# Patient Record
Sex: Male | Born: 1935 | Race: White | Marital: Married | Smoking: Never smoker
Health system: Southern US, Community
[De-identification: ages and names within clinical notes are randomized; demographics above are authoritative.]

## PROBLEM LIST (undated history)

## (undated) DIAGNOSIS — F329 Major depressive disorder, single episode, unspecified: Secondary | ICD-10-CM

## (undated) DIAGNOSIS — I10 Essential (primary) hypertension: Secondary | ICD-10-CM

## (undated) DIAGNOSIS — F32A Depression, unspecified: Secondary | ICD-10-CM

## (undated) DIAGNOSIS — T7840XA Allergy, unspecified, initial encounter: Secondary | ICD-10-CM

## (undated) DIAGNOSIS — F419 Anxiety disorder, unspecified: Secondary | ICD-10-CM

## (undated) DIAGNOSIS — I519 Heart disease, unspecified: Secondary | ICD-10-CM

## (undated) HISTORY — DX: Anxiety disorder, unspecified: F41.9

## (undated) HISTORY — DX: Essential (primary) hypertension: I10

## (undated) HISTORY — PX: TOTAL HIP ARTHROPLASTY: SHX124

## (undated) HISTORY — DX: Major depressive disorder, single episode, unspecified: F32.9

## (undated) HISTORY — DX: Allergy, unspecified, initial encounter: T78.40XA

## (undated) HISTORY — DX: Heart disease, unspecified: I51.9

## (undated) HISTORY — PX: TONSILLECTOMY: SUR1361

## (undated) HISTORY — DX: Depression, unspecified: F32.A

## (undated) HISTORY — PX: CORONARY STENT PLACEMENT: SHX1402

---

## 2015-01-17 DIAGNOSIS — K580 Irritable bowel syndrome with diarrhea: Secondary | ICD-10-CM | POA: Diagnosis not present

## 2015-01-28 DIAGNOSIS — F411 Generalized anxiety disorder: Secondary | ICD-10-CM | POA: Diagnosis not present

## 2015-01-28 DIAGNOSIS — F3289 Other specified depressive episodes: Secondary | ICD-10-CM | POA: Diagnosis not present

## 2015-01-28 DIAGNOSIS — F5101 Primary insomnia: Secondary | ICD-10-CM | POA: Diagnosis not present

## 2015-02-14 DIAGNOSIS — E559 Vitamin D deficiency, unspecified: Secondary | ICD-10-CM | POA: Diagnosis not present

## 2015-02-14 DIAGNOSIS — F439 Reaction to severe stress, unspecified: Secondary | ICD-10-CM | POA: Diagnosis not present

## 2015-02-14 DIAGNOSIS — M1288 Other specific arthropathies, not elsewhere classified, other specified site: Secondary | ICD-10-CM | POA: Diagnosis not present

## 2015-02-14 DIAGNOSIS — E782 Mixed hyperlipidemia: Secondary | ICD-10-CM | POA: Diagnosis not present

## 2015-02-14 DIAGNOSIS — Z23 Encounter for immunization: Secondary | ICD-10-CM | POA: Diagnosis not present

## 2015-02-14 DIAGNOSIS — I10 Essential (primary) hypertension: Secondary | ICD-10-CM | POA: Diagnosis not present

## 2015-03-06 DIAGNOSIS — H2513 Age-related nuclear cataract, bilateral: Secondary | ICD-10-CM | POA: Diagnosis not present

## 2015-03-12 DIAGNOSIS — K529 Noninfective gastroenteritis and colitis, unspecified: Secondary | ICD-10-CM | POA: Diagnosis not present

## 2015-03-12 DIAGNOSIS — K648 Other hemorrhoids: Secondary | ICD-10-CM | POA: Diagnosis not present

## 2015-03-12 DIAGNOSIS — K580 Irritable bowel syndrome with diarrhea: Secondary | ICD-10-CM | POA: Diagnosis not present

## 2015-03-12 DIAGNOSIS — I2510 Atherosclerotic heart disease of native coronary artery without angina pectoris: Secondary | ICD-10-CM | POA: Diagnosis not present

## 2015-03-12 DIAGNOSIS — R100 Acute abdomen: Secondary | ICD-10-CM | POA: Diagnosis not present

## 2015-03-12 DIAGNOSIS — R197 Diarrhea, unspecified: Secondary | ICD-10-CM | POA: Diagnosis not present

## 2015-03-12 DIAGNOSIS — G4733 Obstructive sleep apnea (adult) (pediatric): Secondary | ICD-10-CM | POA: Diagnosis not present

## 2015-03-12 DIAGNOSIS — R109 Unspecified abdominal pain: Secondary | ICD-10-CM | POA: Diagnosis not present

## 2015-03-27 DIAGNOSIS — N401 Enlarged prostate with lower urinary tract symptoms: Secondary | ICD-10-CM | POA: Diagnosis not present

## 2015-03-27 DIAGNOSIS — R3915 Urgency of urination: Secondary | ICD-10-CM | POA: Diagnosis not present

## 2015-03-27 DIAGNOSIS — R350 Frequency of micturition: Secondary | ICD-10-CM | POA: Diagnosis not present

## 2015-04-03 DIAGNOSIS — I6503 Occlusion and stenosis of bilateral vertebral arteries: Secondary | ICD-10-CM | POA: Diagnosis not present

## 2015-04-03 DIAGNOSIS — Z01810 Encounter for preprocedural cardiovascular examination: Secondary | ICD-10-CM | POA: Diagnosis not present

## 2015-04-03 DIAGNOSIS — I2510 Atherosclerotic heart disease of native coronary artery without angina pectoris: Secondary | ICD-10-CM | POA: Diagnosis not present

## 2015-04-03 DIAGNOSIS — Z955 Presence of coronary angioplasty implant and graft: Secondary | ICD-10-CM | POA: Diagnosis not present

## 2015-04-03 DIAGNOSIS — I351 Nonrheumatic aortic (valve) insufficiency: Secondary | ICD-10-CM | POA: Diagnosis not present

## 2015-05-14 DIAGNOSIS — F3289 Other specified depressive episodes: Secondary | ICD-10-CM | POA: Diagnosis not present

## 2015-05-14 DIAGNOSIS — F411 Generalized anxiety disorder: Secondary | ICD-10-CM | POA: Diagnosis not present

## 2015-05-14 DIAGNOSIS — F5101 Primary insomnia: Secondary | ICD-10-CM | POA: Diagnosis not present

## 2015-06-05 DIAGNOSIS — K580 Irritable bowel syndrome with diarrhea: Secondary | ICD-10-CM | POA: Diagnosis not present

## 2015-08-07 DIAGNOSIS — F411 Generalized anxiety disorder: Secondary | ICD-10-CM | POA: Diagnosis not present

## 2015-08-07 DIAGNOSIS — F5101 Primary insomnia: Secondary | ICD-10-CM | POA: Diagnosis not present

## 2015-08-07 DIAGNOSIS — F3289 Other specified depressive episodes: Secondary | ICD-10-CM | POA: Diagnosis not present

## 2015-11-06 DIAGNOSIS — Z23 Encounter for immunization: Secondary | ICD-10-CM | POA: Diagnosis not present

## 2015-11-28 DIAGNOSIS — I493 Ventricular premature depolarization: Secondary | ICD-10-CM | POA: Diagnosis not present

## 2015-11-28 DIAGNOSIS — R0602 Shortness of breath: Secondary | ICD-10-CM | POA: Diagnosis not present

## 2015-11-28 DIAGNOSIS — Z955 Presence of coronary angioplasty implant and graft: Secondary | ICD-10-CM | POA: Diagnosis not present

## 2015-11-28 DIAGNOSIS — I2510 Atherosclerotic heart disease of native coronary artery without angina pectoris: Secondary | ICD-10-CM | POA: Diagnosis not present

## 2015-12-17 DIAGNOSIS — F411 Generalized anxiety disorder: Secondary | ICD-10-CM | POA: Diagnosis not present

## 2015-12-17 DIAGNOSIS — F3289 Other specified depressive episodes: Secondary | ICD-10-CM | POA: Diagnosis not present

## 2015-12-17 DIAGNOSIS — F5101 Primary insomnia: Secondary | ICD-10-CM | POA: Diagnosis not present

## 2016-04-08 DIAGNOSIS — Z6823 Body mass index (BMI) 23.0-23.9, adult: Secondary | ICD-10-CM | POA: Diagnosis not present

## 2016-04-08 DIAGNOSIS — I10 Essential (primary) hypertension: Secondary | ICD-10-CM | POA: Diagnosis not present

## 2016-04-08 DIAGNOSIS — E782 Mixed hyperlipidemia: Secondary | ICD-10-CM | POA: Diagnosis not present

## 2016-04-08 DIAGNOSIS — F439 Reaction to severe stress, unspecified: Secondary | ICD-10-CM | POA: Diagnosis not present

## 2016-04-08 DIAGNOSIS — I2510 Atherosclerotic heart disease of native coronary artery without angina pectoris: Secondary | ICD-10-CM | POA: Diagnosis not present

## 2016-04-08 DIAGNOSIS — E559 Vitamin D deficiency, unspecified: Secondary | ICD-10-CM | POA: Diagnosis not present

## 2016-04-08 DIAGNOSIS — M1288 Other specific arthropathies, not elsewhere classified, other specified site: Secondary | ICD-10-CM | POA: Diagnosis not present

## 2016-04-08 DIAGNOSIS — Z111 Encounter for screening for respiratory tuberculosis: Secondary | ICD-10-CM | POA: Diagnosis not present

## 2016-07-03 ENCOUNTER — Ambulatory Visit (INDEPENDENT_AMBULATORY_CARE_PROVIDER_SITE_OTHER): Payer: Medicare Other | Admitting: Family Medicine

## 2016-07-03 ENCOUNTER — Encounter: Payer: Self-pay | Admitting: Family Medicine

## 2016-07-03 VITALS — BP 130/80 | HR 61 | Temp 98.7°F | Ht 68.0 in | Wt 158.2 lb

## 2016-07-03 DIAGNOSIS — F32A Depression, unspecified: Secondary | ICD-10-CM

## 2016-07-03 DIAGNOSIS — G4733 Obstructive sleep apnea (adult) (pediatric): Secondary | ICD-10-CM

## 2016-07-03 DIAGNOSIS — R198 Other specified symptoms and signs involving the digestive system and abdomen: Secondary | ICD-10-CM | POA: Diagnosis not present

## 2016-07-03 DIAGNOSIS — L570 Actinic keratosis: Secondary | ICD-10-CM

## 2016-07-03 DIAGNOSIS — F329 Major depressive disorder, single episode, unspecified: Secondary | ICD-10-CM | POA: Diagnosis not present

## 2016-07-03 DIAGNOSIS — H539 Unspecified visual disturbance: Secondary | ICD-10-CM

## 2016-07-03 DIAGNOSIS — R350 Frequency of micturition: Secondary | ICD-10-CM

## 2016-07-03 DIAGNOSIS — I2510 Atherosclerotic heart disease of native coronary artery without angina pectoris: Secondary | ICD-10-CM

## 2016-07-03 DIAGNOSIS — F419 Anxiety disorder, unspecified: Secondary | ICD-10-CM

## 2016-07-03 MED ORDER — ATORVASTATIN CALCIUM 10 MG PO TABS: 10.0000 mg | ORAL_TABLET | Freq: Every day | ORAL | 3 refills | Status: DC

## 2016-07-03 NOTE — Patient Instructions (Signed)
Nice to meet you. We will get you referred to see an ophthalmologist, psychiatrist, cardiologist, dermatologist, and ENT physician. I have refilled your Lipitor. If you develop thoughts of harming yourself, inability to swallow, chest pain, or shortness of breath please seek medical attention.

## 2016-07-03 NOTE — Progress Notes (Signed)
[name], MD Phone: [phone]  Shane Acevedo is a 83 y.o. male who presents today for new patient visit.  Anxiety/depression: Patient notes long history of this. It has worsened with their move to some degree. Mostly anxiety though there is some depression. No SI. Some mention of a prior history of bipolar disorder. Currently on Lamictal and trazodone. Was followed by psychiatrist.  Notes difficulty seeing at night particularly signs. Needs to see an ophthalmologist.  CAD: Status post stent placement. On Lipitor. No chest pain or shortness of breath.  History of mild OSA. Not on CPAP. Has difficulty sleeping related to his anxiety. Does not want further intervention for this at this time.   He notes over the last several weeks to a couple of months he's had issues with pills sticking in his throat when he swallows. He did also notes hoarseness that is new compared to prior. Has issues swallowing things like peanut butter as well. Other foods and liquids he does okay with. He was a former smoker 60+ years ago.  Patient notes he is followed by dermatology for actinic keratoses. He had them cryo-treated on his forehead. He does note his skin does tear easily.  He reports he was evaluated by urology for frequent urination and notes everything was cleared.  Active Ambulatory Problems    Diagnosis Date Noted  . Actinic keratoses 07/04/2016  . Vision changes 07/04/2016  . Anxiety and depression 07/04/2016  . Coronary artery disease involving native coronary artery of native heart without angina pectoris 07/04/2016  . Difficulty swallowing pills 07/04/2016  . OSA (obstructive sleep apnea) 07/04/2016  . Frequent urination 07/04/2016   Resolved Ambulatory Problems    Diagnosis Date Noted  . No Resolved Ambulatory Problems   Past Medical History:  Diagnosis Date  . Allergy   . Depression   . Heart disease   . Hypertension     Family History  Problem Relation Age of  Onset  . Alcoholism Other   . Arthritis Other   . Lung cancer Other   . Mental illness Other     Social History   Social History  . Marital status: Married    Spouse name: N/A  . Number of children: N/A  . Years of education: N/A   Occupational History  . Not on file.   Social History Main Topics  . Smoking status: Never Smoker  . Smokeless tobacco: Never Used  . Alcohol use Yes  . Drug use: No  . Sexual activity: Not on file   Other Topics Concern  . Not on file   Social History Narrative  . No narrative on file    ROS  General:  Negative for nexplained weight loss, fever Skin: Negative for new or changing mole, Positive for sore that won't heal HEENT: Positive for trouble seeing, hoarseness, trouble swallowing, Negative for trouble hearing, ringing in ears, mouth sores, change in voice. CV:  Negative for chest pain, dyspnea, edema, palpitations Resp: Negative for cough, dyspnea, hemoptysis GI: Negative for nausea, vomiting, diarrhea, constipation, abdominal pain, melena, hematochezia. GU: Positive for incontinence, frequent urination, sexual difficulty, Negative for dysuria, urinary hesitance, hematuria, vaginal or penile discharge, lumps in testicle or breasts MSK: Negative for muscle cramps or aches, joint pain or swelling Neuro: Negative for headaches, weakness, numbness, dizziness, passing out/fainting Psych: Positive for anxiety, memory problems, Negative for depression  Objective  Physical Exam Vitals:   07/03/16 1402  BP: 130/80  Pulse: 61  Temp: 98.7 F (37.1  C)    BP Readings from Last 3 Encounters:  07/03/16 130/80   Wt Readings from Last 3 Encounters:  07/03/16 158 lb 3.2 oz (71.8 kg)    Physical Exam  Constitutional: No distress.  HENT:  Head: Normocephalic and atraumatic.  Mouth/Throat: Oropharynx is clear and moist.  Eyes: Conjunctivae are normal. Pupils are equal, round, and reactive to light.  Cardiovascular: Normal rate, regular  rhythm and normal heart sounds.   Pulmonary/Chest: Effort normal and breath sounds normal.  Abdominal: Soft. Bowel sounds are normal. He exhibits no distension. There is no tenderness. There is no rebound and no guarding.  Musculoskeletal: He exhibits no edema.  Neurological: He is alert. Gait normal.  Skin: Skin is warm and dry. He is not diaphoretic.  Scattered AK's on forehead  Psychiatric:  Mood anxious, affect anxious     Assessment/Plan:   Actinic keratoses Refer to dermatology.  Coronary artery disease involving native coronary artery of native heart without angina pectoris Asymptomatic. Continue current medications. Refer to cardiology.  Vision changes Vision checked and relatively stable. Refer to ophthalmology.  Anxiety and depression Appears to be stable. We'll refer to psychiatry given possible history of bipolar and that he is on Lamictal.  Difficulty swallowing pills Discussed with patient that this is abnormal. We'll refer to ENT for evaluation.   OSA (obstructive sleep apnea) Patient wanted to continue to monitor. If symptoms worsen he'll let us know.  Frequent urination Previously evaluated by urology. Discussed continuing to monitor and if worsens letting us know.   Orders Placed This Encounter  Procedures  . Ambulatory referral to Ophthalmology    Referral Priority:   Routine    Referral Type:   Consultation    Referral Reason:   Specialty Services Required    Requested Specialty:   Ophthalmology    Number of Visits Requested:   1  . Ambulatory referral to Psychiatry    Referral Priority:   Routine    Referral Type:   Psychiatric    Referral Reason:   Specialty Services Required    Requested Specialty:   Psychiatry    Number of Visits Requested:   1  . Ambulatory referral to Dermatology    Referral Priority:   Routine    Referral Type:   Consultation    Referral Reason:   Specialty Services Required    Requested Specialty:   Dermatology     Number of Visits Requested:   1  . Ambulatory referral to Cardiology    Referral Priority:   Routine    Referral Type:   Consultation    Referral Reason:   Specialty Services Required    Requested Specialty:   Cardiology    Number of Visits Requested:   1  . Ambulatory referral to ENT    Referral Priority:   Routine    Referral Type:   Consultation    Referral Reason:   Specialty Services Required    Requested Specialty:   Otolaryngology    Number of Visits Requested:   [place], [place]

## 2016-07-04 ENCOUNTER — Encounter: Payer: Self-pay | Admitting: Family Medicine

## 2016-07-04 DIAGNOSIS — G4733 Obstructive sleep apnea (adult) (pediatric): Secondary | ICD-10-CM | POA: Insufficient documentation

## 2016-07-04 DIAGNOSIS — R350 Frequency of micturition: Secondary | ICD-10-CM | POA: Insufficient documentation

## 2016-07-04 DIAGNOSIS — H539 Unspecified visual disturbance: Secondary | ICD-10-CM | POA: Insufficient documentation

## 2016-07-04 DIAGNOSIS — F329 Major depressive disorder, single episode, unspecified: Secondary | ICD-10-CM | POA: Insufficient documentation

## 2016-07-04 DIAGNOSIS — I25118 Atherosclerotic heart disease of native coronary artery with other forms of angina pectoris: Secondary | ICD-10-CM | POA: Insufficient documentation

## 2016-07-04 DIAGNOSIS — F419 Anxiety disorder, unspecified: Secondary | ICD-10-CM

## 2016-07-04 DIAGNOSIS — R198 Other specified symptoms and signs involving the digestive system and abdomen: Secondary | ICD-10-CM | POA: Insufficient documentation

## 2016-07-04 DIAGNOSIS — F32A Depression, unspecified: Secondary | ICD-10-CM | POA: Insufficient documentation

## 2016-07-04 DIAGNOSIS — L570 Actinic keratosis: Secondary | ICD-10-CM | POA: Insufficient documentation

## 2016-07-04 NOTE — Assessment & Plan Note (Signed)
Appears to be stable. We'll refer to psychiatry given possible history of bipolar and that he is on Lamictal.

## 2016-07-04 NOTE — Assessment & Plan Note (Signed)
Vision checked and relatively stable. Refer to ophthalmology.

## 2016-07-04 NOTE — Assessment & Plan Note (Signed)
Discussed with patient that this is abnormal. We'll refer to ENT for evaluation.

## 2016-07-04 NOTE — Assessment & Plan Note (Signed)
Refer to dermatology 

## 2016-07-04 NOTE — Assessment & Plan Note (Signed)
Patient wanted to continue to monitor. If symptoms worsen he'll let us know.

## 2016-07-04 NOTE — Assessment & Plan Note (Signed)
Asymptomatic. Continue current medications. Refer to cardiology.

## 2016-07-04 NOTE — Assessment & Plan Note (Signed)
Previously evaluated by urology. Discussed continuing to monitor and if worsens letting us know.

## 2016-07-06 ENCOUNTER — Telehealth: Payer: Self-pay

## 2016-07-06 NOTE — Telephone Encounter (Signed)
Patients wife states she would like patients referral to eye doctor to be asap because he is having trouble seeing when driving and at home, since the prescription for his glasses are out dated.

## 2016-07-06 NOTE — Telephone Encounter (Signed)
Will forward to [facility].

## 2016-07-07 NOTE — Telephone Encounter (Signed)
Scheduled on 6/27 at [facility]. Pt aware

## 2016-07-08 DIAGNOSIS — H5203 Hypermetropia, bilateral: Secondary | ICD-10-CM | POA: Diagnosis not present

## 2016-07-08 DIAGNOSIS — H2513 Age-related nuclear cataract, bilateral: Secondary | ICD-10-CM | POA: Diagnosis not present

## 2016-07-08 DIAGNOSIS — I10 Essential (primary) hypertension: Secondary | ICD-10-CM | POA: Diagnosis not present

## 2016-07-08 DIAGNOSIS — H52223 Regular astigmatism, bilateral: Secondary | ICD-10-CM | POA: Diagnosis not present

## 2016-07-08 DIAGNOSIS — H524 Presbyopia: Secondary | ICD-10-CM | POA: Diagnosis not present

## 2016-07-09 ENCOUNTER — Encounter: Payer: Self-pay | Admitting: Family Medicine

## 2016-07-24 ENCOUNTER — Other Ambulatory Visit: Payer: Self-pay | Admitting: *Deleted

## 2016-07-24 MED ORDER — TRAZODONE HCL 50 MG PO TABS: 50.0000 mg | ORAL_TABLET | Freq: Every day | ORAL | 1 refills | Status: DC

## 2016-07-24 NOTE — Telephone Encounter (Signed)
Medication Refill requested for : trazodone  Pharmacy:[facility] on [address]  Return Contact : [phone]

## 2016-07-24 NOTE — Telephone Encounter (Signed)
Last OV 07/03/16 last filed under historical

## 2016-08-28 ENCOUNTER — Other Ambulatory Visit: Payer: Self-pay

## 2016-08-28 MED ORDER — LAMOTRIGINE 200 MG PO TABS: 200.0000 mg | ORAL_TABLET | Freq: Every day | ORAL | 0 refills | Status: DC

## 2016-08-28 NOTE — Telephone Encounter (Signed)
Last OV 07/03/16 filed under historical

## 2016-08-28 NOTE — Telephone Encounter (Signed)
Spoke with patient regarding his Lamictal. He's been on this for several years for bipolar disorder. He was previously followed by a psychiatrist prior to moving to [facility]. A referral has been placed to psychiatry though has not been scheduled. We will refill his Lamictal for one month. Discussed risk of rash and Stevens-Johnson syndrome. He'll monitor and if those things happen to be evaluated. We'll get him set up with psychiatry.

## 2016-10-05 ENCOUNTER — Ambulatory Visit (INDEPENDENT_AMBULATORY_CARE_PROVIDER_SITE_OTHER): Payer: Medicare Other | Admitting: Family Medicine

## 2016-10-05 ENCOUNTER — Encounter: Payer: Self-pay | Admitting: Family Medicine

## 2016-10-05 DIAGNOSIS — L570 Actinic keratosis: Secondary | ICD-10-CM | POA: Diagnosis not present

## 2016-10-05 DIAGNOSIS — Z23 Encounter for immunization: Secondary | ICD-10-CM | POA: Diagnosis not present

## 2016-10-05 DIAGNOSIS — F329 Major depressive disorder, single episode, unspecified: Secondary | ICD-10-CM | POA: Diagnosis not present

## 2016-10-05 DIAGNOSIS — I2510 Atherosclerotic heart disease of native coronary artery without angina pectoris: Secondary | ICD-10-CM

## 2016-10-05 DIAGNOSIS — F419 Anxiety disorder, unspecified: Secondary | ICD-10-CM | POA: Diagnosis not present

## 2016-10-05 DIAGNOSIS — F32A Depression, unspecified: Secondary | ICD-10-CM

## 2016-10-05 DIAGNOSIS — R198 Other specified symptoms and signs involving the digestive system and abdomen: Secondary | ICD-10-CM

## 2016-10-05 DIAGNOSIS — H539 Unspecified visual disturbance: Secondary | ICD-10-CM | POA: Diagnosis not present

## 2016-10-05 NOTE — Patient Instructions (Signed)
Nice to see you. We'll check on your referrals.

## 2016-10-05 NOTE — Assessment & Plan Note (Signed)
Asymptomatic. He'll keep his appointment with cardiology.

## 2016-10-05 NOTE — Assessment & Plan Note (Addendum)
Stable. Recent labs reviewed. We'll check on his psychiatry referral.

## 2016-10-05 NOTE — Assessment & Plan Note (Signed)
Improved with new glasses. Continue to monitor.

## 2016-10-05 NOTE — Progress Notes (Signed)
  [name], MD Phone: [phone]  Shane Acevedo is a 83 y.o. male who presents today for follow-up.  Anxiety/depression: Notes mild symptoms now. Still a lot of stress with the move. Has a little trouble sleeping. Currently on melatonin and trazodone for that. Continuing Lamictal as well. No SI or HI. Recent labs reviewed.  Continues to have issues with feeling as though food is sticking in his throat. Notes it is better than it was previously. No reflux symptoms. He has not yet seen ENT.  Has an appointment with cardiology set up for next week. He notes no chest pain or shortness of breath.  He sees dermatology this week.  He did see ophthalmology for his vision issues and they gave him a new prescription which does work better at night.  PMH: nonsmoker.   ROS see history of present illness  Objective  Physical Exam Vitals:   10/05/16 1413  BP: (!) 142/84  Pulse: (!) 57  Temp: 97.9 F (36.6 C)  SpO2: 98%    BP Readings from Last 3 Encounters:  10/05/16 (!) 142/84  07/03/16 130/80   Wt Readings from Last 3 Encounters:  10/05/16 156 lb (70.8 kg)  07/03/16 158 lb 3.2 oz (71.8 kg)    Physical Exam  Constitutional: No distress.  Cardiovascular: Normal rate, regular rhythm and normal heart sounds.   Pulmonary/Chest: Effort normal and breath sounds normal.  Musculoskeletal: He exhibits no edema.  Neurological: He is alert. Gait normal.  Skin: Skin is warm and dry. He is not diaphoretic.     Assessment/Plan: Please see individual problem list.  Coronary artery disease involving native coronary artery of native heart without angina pectoris Asymptomatic. He'll keep his appointment with cardiology.  Actinic keratoses Keep dermatology appointment for evaluation.  Vision changes Improved with new glasses. Continue to monitor.  Anxiety and depression Stable. Recent labs reviewed. We'll check on his psychiatry referral.  Difficulty swallowing  pills We will follow up on his ENT referral. He notes this is slightly improved.  [name], MD [place]

## 2016-10-05 NOTE — Assessment & Plan Note (Signed)
Keep dermatology appointment for evaluation.

## 2016-10-05 NOTE — Assessment & Plan Note (Signed)
We will follow up on his ENT referral. He notes this is slightly improved.

## 2016-10-06 ENCOUNTER — Encounter: Payer: Self-pay | Admitting: Family Medicine

## 2016-10-06 ENCOUNTER — Other Ambulatory Visit: Payer: Self-pay | Admitting: Family Medicine

## 2016-10-07 DIAGNOSIS — L570 Actinic keratosis: Secondary | ICD-10-CM | POA: Diagnosis not present

## 2016-10-07 DIAGNOSIS — L218 Other seborrheic dermatitis: Secondary | ICD-10-CM | POA: Diagnosis not present

## 2016-10-07 DIAGNOSIS — D2261 Melanocytic nevi of right upper limb, including shoulder: Secondary | ICD-10-CM | POA: Diagnosis not present

## 2016-10-07 DIAGNOSIS — D2272 Melanocytic nevi of left lower limb, including hip: Secondary | ICD-10-CM | POA: Diagnosis not present

## 2016-10-07 DIAGNOSIS — D225 Melanocytic nevi of trunk: Secondary | ICD-10-CM | POA: Diagnosis not present

## 2016-10-07 DIAGNOSIS — X32XXXA Exposure to sunlight, initial encounter: Secondary | ICD-10-CM | POA: Diagnosis not present

## 2016-10-12 ENCOUNTER — Ambulatory Visit: Payer: Medicare Other | Admitting: Cardiovascular Disease

## 2016-10-29 DIAGNOSIS — K219 Gastro-esophageal reflux disease without esophagitis: Secondary | ICD-10-CM | POA: Diagnosis not present

## 2016-10-29 DIAGNOSIS — G4733 Obstructive sleep apnea (adult) (pediatric): Secondary | ICD-10-CM | POA: Diagnosis not present

## 2016-10-29 DIAGNOSIS — R490 Dysphonia: Secondary | ICD-10-CM | POA: Diagnosis not present

## 2016-11-09 ENCOUNTER — Ambulatory Visit (INDEPENDENT_AMBULATORY_CARE_PROVIDER_SITE_OTHER): Payer: Medicare Other | Admitting: Psychiatry

## 2016-11-09 ENCOUNTER — Encounter: Payer: Self-pay | Admitting: Psychiatry

## 2016-11-09 VITALS — BP 168/75 | HR 89 | Temp 97.9°F | Wt 160.2 lb

## 2016-11-09 DIAGNOSIS — G4733 Obstructive sleep apnea (adult) (pediatric): Secondary | ICD-10-CM | POA: Diagnosis not present

## 2016-11-09 DIAGNOSIS — F411 Generalized anxiety disorder: Secondary | ICD-10-CM | POA: Diagnosis not present

## 2016-11-09 MED ORDER — TRAZODONE HCL 50 MG PO TABS: 50.0000 mg | ORAL_TABLET | Freq: Every day | ORAL | 2 refills | Status: DC

## 2016-11-09 MED ORDER — CITALOPRAM HYDROBROMIDE 10 MG PO TABS: 10.0000 mg | ORAL_TABLET | Freq: Every day | ORAL | 2 refills | Status: DC

## 2016-11-09 MED ORDER — LAMOTRIGINE 200 MG PO TABS: 200.0000 mg | ORAL_TABLET | Freq: Every day | ORAL | 2 refills | Status: DC

## 2016-11-09 NOTE — Progress Notes (Signed)
Psychiatric Initial Adult Assessment   Patient Identification: Shane Acevedo MRN:  [id number] Date of Evaluation:  11/09/2016 Referral Source: Dr.Sonnenberg Chief Complaint:  " I am anxious". Chief Complaint    Establish Care; Stress; Anxiety     Visit Diagnosis:    ICD-10-CM   1. GAD (generalized anxiety disorder) F41.1 lamoTRIgine (LAMICTAL) 200 MG tablet    traZODone (DESYREL) 50 MG tablet    citalopram (CELEXA) 10 MG tablet  2. OSA (obstructive sleep apnea) G47.33     History of Present Illness: Shane Acevedo is an 83 year old Caucasian male, who is married, lives in [place], has a history of anxiety, as well as mood symptoms, insomnia, who presented to the clinic today to establish care.  Ko today presents as pleasant but talkative.  Dominque reports that he recently moved to [place] to be closer to his daughter.  He reports that the move itself has been overwhelming for him.  He currently lives in a senior citizen community.  His wife also lives with him.  He reports that he himself struggles with a lot of health issues.  He also takes care of his wife who is dealing with a lot of health issues herself.  He reports that his wife requires constant attention from him.  And this can sometimes be overwhelming for him.  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 He has a housekeeper who comes and helps him very week .  He reports he also struggle with some financial issues that happened during the move.  He reports that he worries constantly.  He also has some physical symptoms of anxiety like feeling flushed.  He denies any chest pain shortness of breath or other anxiety attack symptoms.  He does report feeling sad on and off.  He denies any crying spells.  He denies any hopelessness.  He denies any suicidal thoughts.  He does report sleep issues on and off.  He reports he takes trazodone 50 mg as well as melatonin.  He reports that his sleep varies anywhere from 5-7 hours.   He also has a diagnosis of OSA.  He does not use a CPAP and was told he does not need it, however ,his sleep study was done 4 years ago.  He denies any psychosis.  He does report some memory problems on and off.  He reports that he can be forgetful and needs to keep checklist and alerts to remind him of things that he needs to do.  He reports that he copes by talking to people, has joined some clubs, going for walks with his dog.  He reports that all this relaxes him and isgood therapy for him.  He reports that he currently takes a medication called lamotrigine 200 mg, which was initiated by his psychiatrist in [place].  He reports that it was started because they felt he may be in the bipolar spectrum, because he talked a lot on and off and was impulsive.  However he denies any history of manic symptoms or hypomania.  He appears oriented , alert, was able to give writer information about his mental health issues, medications as well as was able to give correct details about his personal information like his address, phone number.   He denies any inpatient hospital admissions for mental health issues.  He denies any suicide attempts.  Associated Signs/Symptoms: Depression Symptoms:  depressed mood, difficulty concentrating, anxiety, (Hypo) Manic Symptoms:  Distractibility, Anxiety Symptoms:  Excessive Worry, Psychotic Symptoms:  denies PTSD Symptoms: Negative  Past Psychiatric History: History of anxiety and insomnia.  Carried a diagnosis of mood disorder in the past.  He was being treated by Dr. Barbarann Ehlers in [place].  He denies any history of suicide attempts.  He denies any past mental health hospital admissions.  Previous Psychotropic Medications: Yes - celexa- for a week , discontinued it since he did not want to be on lots of medications.  Substance Abuse History in the last 12 months:  No.  Consequences of Substance Abuse: Negative  Past Medical History:  Past Medical History:   Diagnosis Date  . Allergy   . Anxiety   . Depression   . Heart disease   . Hypertension     Past Surgical History:  Procedure Laterality Date  . CORONARY STENT PLACEMENT Right   . TONSILLECTOMY    . TOTAL HIP ARTHROPLASTY Right     Family Psychiatric History: Father has history of bipolar disorder, sister has paranoid schizophrenia, mother with Alzheimer's dementia, wife has depression.  Family History:  Family History  Problem Relation Age of Onset  . Alcoholism Other   . Arthritis Other   . Lung cancer Other   . Mental illness Other   . Bipolar disorder Father   . Schizophrenia Sister     Social History:   Social History   Social History  . Marital status: Married    Spouse name: N/A  . Number of children: N/A  . Years of education: N/A   Social History Main Topics  . Smoking status: Never Smoker  . Smokeless tobacco: Never Used  . Alcohol use 2.4 oz/week    2 Glasses of wine, 2 Cans of beer per week  . Drug use: No  . Sexual activity: Not Currently   Other Topics Concern  . None   Social History Narrative  . None    Additional Social History: He is married.  He has 2 daughters.  He used to work in the [employer], inspected hospitals.  He also did a lot of other work in the past.  He is currently retired.  He moved from [place] to [facility] in May 2018 to be closer to his daughter who currently lives in [place]. He currently lives in a senior living community in [place].  Allergies:  No Known Allergies  Metabolic Disorder Labs: No results found for: HGBA1C, MPG No results found for: PROLACTIN No results found for: CHOL, TRIG, HDL, CHOLHDL, VLDL, LDLCALC   Current Medications: Current Outpatient Prescriptions  Medication Sig Dispense Refill  . amLODipine (NORVASC) 10 MG tablet Take 10 mg by mouth daily.    Marland Kitchen aspirin EC 81 MG tablet Take 81 mg by mouth daily.    Marland Kitchen atorvastatin (LIPITOR) 10 MG tablet Take 1 tablet (10 mg  total) by mouth daily. 90 tablet 3  . lamoTRIgine (LAMICTAL) 200 MG tablet Take 1 tablet (200 mg total) by mouth daily. 30 tablet 2  . Melatonin 10 MG TABS Take by mouth.    . Multiple Vitamins-Minerals (CENTRUM SILVER PO) Take by mouth.    . traZODone (DESYREL) 50 MG tablet Take 1-2 tablets (50-100 mg total) by mouth at bedtime. 60 tablet 2  . citalopram (CELEXA) 10 MG tablet Take 1 tablet (10 mg total) by mouth daily. 30 tablet 2   No current facility-administered medications for this visit.     Neurologic: Headache: No Seizure: No Paresthesias:No  Musculoskeletal: Strength & Muscle Tone: within normal limits  Gait & Station: normal Patient leans: N/A  Psychiatric Specialty Exam: Review of Systems  Psychiatric/Behavioral: Positive for depression. The patient is nervous/anxious and has insomnia.   All other systems reviewed and are negative.   Blood pressure (!) 168/75, pulse 89, temperature 97.9 F (36.6 C), temperature source Oral, weight 160 lb 3.2 oz (72.7 kg).Body mass index is 24.36 kg/m.  General Appearance: Casual  Eye Contact:  Fair  Speech:  Clear and Coherent  Volume:  Normal  Mood:  Anxious and Dysphoric  Affect:  Congruent  Thought Process:  Goal Directed and Descriptions of Associations: Intact  Orientation:  Full (Time, Place, and Person)  Thought Content:  Logical  Suicidal Thoughts:  No  Homicidal Thoughts:  No  Memory:  Immediate;   Fair Recent;   Fair Remote;   Fair  Judgement:  Fair  Insight:  Fair  Psychomotor Activity:  Normal  Concentration:  Concentration: Fair and Attention Span: Fair  Recall:  [employer] of Knowledge:Fair  Language: Fair  Akathisia:  No  Handed:  Right  AIMS (if indicated):  NA  Assets:  Communication Skills Desire for Improvement Housing Intimacy Social Support Talents/Skills Transportation Vocational/Educational  ADL's:  Intact  Cognition: WNL  Sleep:  Fair on medications    Treatment Plan Summary: Mont[place] is  an 83 year old Caucasian male who has a history of anxiety, mood disorder, insomnia, OSA , as well as medical problems like coronary artery disease, hypertension , who presented to the clinic today to establish care.  Darry struggles with the recent relocation to [facility], health issues, health problems of his wife as well as financial issues.  Avyn however has good coping techniques, has good social support from his daughter who lives close by, and has access to healthcare, and is motivated to get help.  Will start him on medications as noted below.  He is a good candidate for outpatient treatment. Medication management and Plan see below  Anxiety Start Celexa 10 mg p.o. daily. Continue lamotrigine 200 mg p.o. daily.  For insomnia Patient reports a history of OSA.  Reports he did not needed to be on CPAP, however this was done 4 years ago. Discussed referral back to sleep clinic, repeat sleep study. Continue trazodone 50-100 mg p.o. nightly as needed for sleep. Discussed to try stopping the melatonin.  Discussed getting his TSH as well as an EKG of his heart,  he reports that he will be able to get it from his primary medical doctor who had done some of the testings 2 months ago.  I have reviewed medical records from Barbarann Ehlers [place].  Provided medication education.  Provided handouts.  Follow-up in 4 weeks.  More than 50 % of the time was spent for psychoeducation and supportive psychotherapy and care coordination.  This note was generated in part or whole with voice recognition software. Voice recognition is usually quite accurate but there are transcription errors that can and very often do occur. I apologize for any typographical errors that were not detected and corrected.       Ursula Alert, MD 10/29/20182:58 PM

## 2016-11-09 NOTE — Patient Instructions (Signed)
Citalopram tablets What is this medicine? CITALOPRAM (sye TAL oh pram) is a medicine for depression. This medicine may be used for other purposes; ask your health care provider or pharmacist if you have questions. COMMON BRAND NAME(S): Celexa What should I tell my health care provider before I take this medicine? They need to know if you have any of these conditions: -bleeding disorders -bipolar disorder or a family history of bipolar disorder -glaucoma -heart disease -history of irregular heartbeat -kidney disease -liver disease -low levels of magnesium or potassium in the blood -receiving electroconvulsive therapy -seizures -suicidal thoughts, plans, or attempt; a previous suicide attempt by you or a family member -take medicines that treat or prevent blood clots -thyroid disease -an unusual or allergic reaction to citalopram, escitalopram, other medicines, foods, dyes, or preservatives -pregnant or trying to become pregnant -breast-feeding How should I use this medicine? Take this medicine by mouth with a glass of water. Follow the directions on the prescription label. You can take it with or without food. Take your medicine at regular intervals. Do not take your medicine more often than directed. Do not stop taking this medicine suddenly except upon the advice of your doctor. Stopping this medicine too quickly may cause serious side effects or your condition may worsen. A special MedGuide will be given to you by the pharmacist with each prescription and refill. Be sure to read this information carefully each time. Talk to your pediatrician regarding the use of this medicine in children. Special care may be needed. Patients over 81 years old may have a stronger reaction and need a smaller dose. Overdosage: If you think you have taken too much of this medicine contact a poison control center or emergency room at once. NOTE: This medicine is only for you. Do not share this medicine with  others. What if I miss a dose? If you miss a dose, take it as soon as you can. If it is almost time for your next dose, take only that dose. Do not take double or extra doses. What may interact with this medicine? Do not take this medicine with any of the following medications: -certain medicines for fungal infections like fluconazole, itraconazole, ketoconazole, posaconazole, voriconazole -cisapride -dofetilide -dronedarone -escitalopram -linezolid -MAOIs like Carbex, Eldepryl, Marplan, Nardil, and Parnate -methylene blue (injected into a vein) -pimozide -thioridazine -ziprasidone This medicine may also interact with the following medications: -alcohol -amphetamines -aspirin and aspirin-like medicines -carbamazepine -certain medicines for depression, anxiety, or psychotic disturbances -certain medicines for infections like chloroquine, clarithromycin, erythromycin, furazolidone, isoniazid, pentamidine -certain medicines for migraine headaches like almotriptan, eletriptan, frovatriptan, naratriptan, rizatriptan, sumatriptan, zolmitriptan -certain medicines for sleep -certain medicines that treat or prevent blood clots like dalteparin, enoxaparin, warfarin -cimetidine -diuretics -fentanyl -lithium -methadone -metoprolol -NSAIDs, medicines for pain and inflammation, like ibuprofen or naproxen -omeprazole -other medicines that prolong the QT interval (cause an abnormal heart rhythm) -procarbazine -rasagiline -supplements like St. Damondre's wort, kava kava, valerian -tramadol -tryptophan This list may not describe all possible interactions. Give your health care provider a list of all the medicines, herbs, non-prescription drugs, or dietary supplements you use. Also tell them if you smoke, drink alcohol, or use illegal drugs. Some items may interact with your medicine. What should I watch for while using this medicine? Tell your doctor if your symptoms do not get better or if they  get worse. Visit your doctor or health care professional for regular checks on your progress. Because it may take several weeks to see the full   effects of this medicine, it is important to continue your treatment as prescribed by your doctor. Patients and their families should watch out for new or worsening thoughts of suicide or depression. Also watch out for sudden changes in feelings such as feeling anxious, agitated, panicky, irritable, hostile, aggressive, impulsive, severely restless, overly excited and hyperactive, or not being able to sleep. If this happens, especially at the beginning of treatment or after a change in dose, call your health care professional. You may get drowsy or dizzy. Do not drive, use machinery, or do anything that needs mental alertness until you know how this medicine affects you. Do not stand or sit up quickly, especially if you are an older patient. This reduces the risk of dizzy or fainting spells. Alcohol may interfere with the effect of this medicine. Avoid alcoholic drinks. Your mouth may get dry. Chewing sugarless gum or sucking hard candy, and drinking plenty of water will help. Contact your doctor if the problem does not go away or is severe. What side effects may I notice from receiving this medicine? Side effects that you should report to your doctor or health care professional as soon as possible: -allergic reactions like skin rash, itching or hives, swelling of the face, lips, or tongue -anxious -black, tarry stools -breathing problems -changes in vision -chest pain -confusion -elevated mood, decreased need for sleep, racing thoughts, impulsive behavior -eye pain -fast, irregular heartbeat -feeling faint or lightheaded, falls -feeling agitated, angry, or irritable -hallucination, loss of contact with reality -loss of balance or coordination -loss of memory -painful or prolonged erections -restlessness, pacing, inability to keep  still -seizures -stiff muscles -suicidal thoughts or other mood changes -trouble sleeping -unusual bleeding or bruising -unusually weak or tired -vomiting Side effects that usually do not require medical attention (report to your doctor or health care professional if they continue or are bothersome): -change in appetite or weight -change in sex drive or performance -dizziness -headache -increased sweating -indigestion, nausea -tremors This list may not describe all possible side effects. Call your doctor for medical advice about side effects. You may report side effects to FDA at 1-800-FDA-1088. Where should I keep my medicine? Keep out of reach of children. Store at room temperature between 15 and 30 degrees C (59 and 86 degrees F). Throw away any unused medicine after the expiration date. NOTE: This sheet is a summary. It may not cover all possible information. If you have questions about this medicine, talk to your doctor, pharmacist, or health care provider.  2018 Elsevier/Gold Standard (2015-06-03 13:18:52)  

## 2016-11-11 ENCOUNTER — Other Ambulatory Visit: Payer: Self-pay | Admitting: Psychiatry

## 2016-11-11 DIAGNOSIS — F411 Generalized anxiety disorder: Secondary | ICD-10-CM

## 2016-11-11 MED ORDER — ESCITALOPRAM OXALATE 5 MG PO TABS: 5.0000 mg | ORAL_TABLET | Freq: Every day | ORAL | 1 refills | Status: DC

## 2016-11-11 NOTE — Telephone Encounter (Signed)
Patient called Shane Acevedo CMA stating he wants to change his celexa to lexapro. He feels his wife has a good response to it and so will he . Will start Lexapro. Will stop celexa.

## 2016-11-16 ENCOUNTER — Telehealth: Payer: Self-pay

## 2016-11-16 NOTE — Telephone Encounter (Signed)
pt called he can not find rx for the lexapro.  he can not find.

## 2016-11-17 NOTE — Telephone Encounter (Signed)
Rx was called into the [facility] per dr. Shea Evans ok.  Pt was also notified that rx was called in.

## 2016-11-17 NOTE — Telephone Encounter (Signed)
Yes, please Janett Billow. thankx

## 2016-11-26 ENCOUNTER — Ambulatory Visit (INDEPENDENT_AMBULATORY_CARE_PROVIDER_SITE_OTHER): Payer: Medicare Other | Admitting: Cardiovascular Disease

## 2016-11-26 ENCOUNTER — Encounter: Payer: Self-pay | Admitting: Cardiovascular Disease

## 2016-11-26 VITALS — BP 140/66 | HR 51 | Ht 69.0 in | Wt 158.5 lb

## 2016-11-26 DIAGNOSIS — E782 Mixed hyperlipidemia: Secondary | ICD-10-CM | POA: Diagnosis not present

## 2016-11-26 DIAGNOSIS — I25118 Atherosclerotic heart disease of native coronary artery with other forms of angina pectoris: Secondary | ICD-10-CM

## 2016-11-26 DIAGNOSIS — G4733 Obstructive sleep apnea (adult) (pediatric): Secondary | ICD-10-CM | POA: Diagnosis not present

## 2016-11-26 DIAGNOSIS — F32A Depression, unspecified: Secondary | ICD-10-CM

## 2016-11-26 DIAGNOSIS — Z955 Presence of coronary angioplasty implant and graft: Secondary | ICD-10-CM | POA: Diagnosis not present

## 2016-11-26 DIAGNOSIS — Z87891 Personal history of nicotine dependence: Secondary | ICD-10-CM | POA: Diagnosis not present

## 2016-11-26 DIAGNOSIS — I209 Angina pectoris, unspecified: Secondary | ICD-10-CM | POA: Diagnosis not present

## 2016-11-26 DIAGNOSIS — I2510 Atherosclerotic heart disease of native coronary artery without angina pectoris: Secondary | ICD-10-CM | POA: Diagnosis not present

## 2016-11-26 DIAGNOSIS — F329 Major depressive disorder, single episode, unspecified: Secondary | ICD-10-CM | POA: Diagnosis not present

## 2016-11-26 DIAGNOSIS — I10 Essential (primary) hypertension: Secondary | ICD-10-CM

## 2016-11-26 DIAGNOSIS — F419 Anxiety disorder, unspecified: Secondary | ICD-10-CM | POA: Diagnosis not present

## 2016-11-26 DIAGNOSIS — E785 Hyperlipidemia, unspecified: Secondary | ICD-10-CM | POA: Insufficient documentation

## 2016-11-26 NOTE — Progress Notes (Signed)
Cardiology Office Note  Date:  11/26/2016   ID:  Shane Acevedo, DOB March 16, 1933, MRN [id number]  PCP:  Leone Haven, MD   Chief Complaint  Patient presents with  . other    New patient. Referred by Dr. Caryl Bis for CAD. Patient c/o blood pressure being high. Meds reviewed verbally with patient.     HPI:  Shane Acevedo is a 83 y.o. 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 Caryl Bis for consultation of his coronary disease   Reports that over the past 20 years he has done relatively well Initially had angina, shortness of breath with stent placement 1997 denies having any interventions since that time  Managed by Dr. Nat Christen at [facility], had periodic routine treadmill studies Previous records reviewed  Reports he is active at baseline, Does water volleyball  Has a history of Anxiety/depression:  mild symptoms  Still a lot of stress , after recent move, financial stressors   Notes from primary care indicating he has periodic  feeling as though food is sticking in his throat.   Previous testing reviewed with him in detail today Treadmill stress test 11/2015 Echo 03/2015: normal EF, mild AI and MR Carotid u/s 03/2015: mild to moderate b/l   EKG personally reviewed by myself on todays visit Shows sinus bradycardia rate 51 bpm nonspecific ST abnormality, likely repolarization abnormality   PMH:   has a past medical history of Allergy, Anxiety, Depression, Heart disease, and Hypertension.  PSH:    Past Surgical History:  Procedure Laterality Date  . CORONARY STENT PLACEMENT Right   . TONSILLECTOMY    . TOTAL HIP ARTHROPLASTY Right     Current Outpatient Medications  Medication Sig Dispense Refill  .  amLODipine (NORVASC) 10 MG tablet Take 10 mg by mouth daily.    Marland Kitchen aspirin EC 81 MG tablet Take 81 mg by mouth daily.    Marland Kitchen atorvastatin (LIPITOR) 10 MG tablet Take 1 tablet (10 mg total) by mouth daily. 90 tablet 3  . escitalopram (LEXAPRO) 5 MG tablet Take 1 tablet (5 mg total) by mouth daily. 30 tablet 1  . lamoTRIgine (LAMICTAL) 200 MG tablet Take 1 tablet (200 mg total) by mouth daily. 30 tablet 2  . Melatonin 10 MG TABS Take by mouth.    . Multiple Vitamins-Minerals (CENTRUM SILVER PO) Take by mouth.    . traZODone (DESYREL) 50 MG tablet Take 1-2 tablets (50-100 mg total) by mouth at bedtime. 60 tablet 2   No current facility-administered medications for this visit.      Allergies:   Patient has no known allergies.   Social History:  The patient  reports that  has never smoked. he has never used smokeless tobacco. He reports that he drinks about 2.4 oz of alcohol per week. He reports that he does not use drugs.   Family History:   family history includes Alcoholism in his other; Arthritis in his other; Bipolar disorder in his father; Lung cancer in his other; Mental illness in his other; Schizophrenia in his sister.    Review of Systems: Review of Systems  Constitutional: Negative.   Respiratory: Negative.   Cardiovascular: Negative.   Gastrointestinal: Negative.   Musculoskeletal: Negative.   Neurological: Negative.   Psychiatric/Behavioral: Negative.  All other systems reviewed and are negative.    PHYSICAL EXAM: VS:  BP 140/66 (BP Location: Left Arm, Patient Position: Sitting, Cuff Size: Normal)   Pulse (!) 51   Ht 5\' 9"  (1.753 m)   Wt 158 lb 8 oz (71.9 kg)   BMI 23.41 kg/m  , BMI Body mass index is 23.41 kg/m. GEN: Well nourished, well developed, in no acute distress  HEENT: normal  Neck: no JVD, carotid bruits, or masses Cardiac: RRR; no murmurs, rubs, or gallops,no edema  Respiratory:  clear to auscultation bilaterally, normal work of breathing GI: soft,  nontender, nondistended, + BS MS: no deformity or atrophy  Skin: warm and dry, no rash Neuro:  Strength and sensation are intact Psych: euthymic mood, full affect    Recent Labs: No results found for requested labs within last 8760 hours.    Lipid Panel No results found for: CHOL, HDL, LDLCALC, TRIG    Wt Readings from Last 3 Encounters:  11/26/16 158 lb 8 oz (71.9 kg)  10/05/16 156 lb (70.8 kg)  07/03/16 158 lb 3.2 oz (71.8 kg)       ASSESSMENT AND PLAN:  Coronary artery disease of native artery of native heart with stable angina pectoris ([place]) - Plan: EKG 12-Lead Currently with no symptoms of angina. No further workup at this time. Continue current medication regimen.  Discussed previous anginal symptoms with him  OSA (obstructive sleep apnea) - Plan: EKG 12-Lead  Mixed hyperlipidemia - Plan: EKG 12-Lead Cholesterol is at goal on the current lipid regimen. No changes to the medications were made.  History of coronary artery stent placement Prior history of stent 20 years ago No intervention since that time  Essential hypertension Blood pressure elevated on arrival, improved on recheck Does not on a blood pressure cuff at home, recommended he buy one or check when at the wellness center where he goes to exercise Recommended systolic pressure less than 135  Anxiety and depression Significant stressors on recent move, financial stressors per the patient  Smoking history Remote smoking history when he was younger   Disposition:   F/U  6 months   Total encounter time more than 45 minutes  Greater than 50% was spent in counseling and coordination of care with the patient    Orders Placed This Encounter  Procedures  . EKG 12-Lead     Signed, Esmond Plants, M.D., Ph.D. 11/26/2016  [place], [place]

## 2016-11-26 NOTE — Patient Instructions (Addendum)
Please monitor your blood pressure   Medication Instructions:   No medication changes made  Labwork:  No new labs needed  Testing/Procedures:  No further testing at this time   Follow-Up: It was a pleasure seeing you in the office today. Please call us if you have new issues that need to be addressed before your next appt.  [phone]  Your physician wants you to follow-up in: 6 months.  You will receive a reminder letter in the mail two months in advance. If you don't receive a letter, please call our office to schedule the follow-up appointment.  If you need a refill on your cardiac medications before your next appointment, please call your pharmacy.

## 2016-12-25 ENCOUNTER — Other Ambulatory Visit: Payer: Self-pay

## 2016-12-25 DIAGNOSIS — R490 Dysphonia: Secondary | ICD-10-CM | POA: Diagnosis not present

## 2016-12-25 DIAGNOSIS — K219 Gastro-esophageal reflux disease without esophagitis: Secondary | ICD-10-CM | POA: Diagnosis not present

## 2016-12-25 DIAGNOSIS — F411 Generalized anxiety disorder: Secondary | ICD-10-CM

## 2016-12-25 NOTE — Telephone Encounter (Signed)
Last OV 10/05/16 last filled by Dr.Eappen 11/09/16 30 2rf Requesting 90 days supply

## 2016-12-25 NOTE — Telephone Encounter (Signed)
Patients wife notified

## 2016-12-25 NOTE — Telephone Encounter (Signed)
This medication refill needs to go to Dr Shea Evans. This is not a medication that I am comfortable refilling. Thanks.

## 2017-01-15 ENCOUNTER — Ambulatory Visit: Payer: Medicare Other | Admitting: Family Medicine

## 2017-01-18 ENCOUNTER — Other Ambulatory Visit: Payer: Self-pay

## 2017-01-18 ENCOUNTER — Encounter: Payer: Self-pay | Admitting: Family Medicine

## 2017-01-18 ENCOUNTER — Ambulatory Visit (INDEPENDENT_AMBULATORY_CARE_PROVIDER_SITE_OTHER): Payer: Medicare Other | Admitting: Family Medicine

## 2017-01-18 VITALS — BP 142/80 | HR 63 | Temp 98.3°F | Wt 160.0 lb

## 2017-01-18 DIAGNOSIS — F32A Depression, unspecified: Secondary | ICD-10-CM

## 2017-01-18 DIAGNOSIS — R351 Nocturia: Secondary | ICD-10-CM

## 2017-01-18 DIAGNOSIS — G4733 Obstructive sleep apnea (adult) (pediatric): Secondary | ICD-10-CM | POA: Diagnosis not present

## 2017-01-18 DIAGNOSIS — F419 Anxiety disorder, unspecified: Secondary | ICD-10-CM

## 2017-01-18 DIAGNOSIS — R198 Other specified symptoms and signs involving the digestive system and abdomen: Secondary | ICD-10-CM | POA: Diagnosis not present

## 2017-01-18 DIAGNOSIS — Z8781 Personal history of (healed) traumatic fracture: Secondary | ICD-10-CM

## 2017-01-18 DIAGNOSIS — N401 Enlarged prostate with lower urinary tract symptoms: Secondary | ICD-10-CM | POA: Diagnosis not present

## 2017-01-18 DIAGNOSIS — Z5181 Encounter for therapeutic drug level monitoring: Secondary | ICD-10-CM | POA: Diagnosis not present

## 2017-01-18 DIAGNOSIS — F329 Major depressive disorder, single episode, unspecified: Secondary | ICD-10-CM | POA: Diagnosis not present

## 2017-01-18 DIAGNOSIS — I10 Essential (primary) hypertension: Secondary | ICD-10-CM

## 2017-01-18 DIAGNOSIS — R350 Frequency of micturition: Secondary | ICD-10-CM

## 2017-01-18 LAB — POCT URINALYSIS DIPSTICK
BILIRUBIN UA: NEGATIVE
Glucose, UA: NEGATIVE
Ketones, UA: NEGATIVE
LEUKOCYTES UA: NEGATIVE
Nitrite, UA: NEGATIVE
PH UA: 5 (ref 5.0–8.0)
PROTEIN UA: NEGATIVE
RBC UA: NEGATIVE
Spec Grav, UA: 1.01 (ref 1.010–1.025)
UROBILINOGEN UA: 0.2 U/dL

## 2017-01-18 LAB — COMPREHENSIVE METABOLIC PANEL
ALBUMIN: 4 g/dL (ref 3.5–5.2)
ALT: 12 U/L (ref 0–53)
AST: 12 U/L (ref 0–37)
Alkaline Phosphatase: 63 U/L (ref 39–117)
BUN: 20 mg/dL (ref 6–23)
CHLORIDE: 104 meq/L (ref 96–112)
CO2: 27 meq/L (ref 19–32)
CREATININE: 1.11 mg/dL (ref 0.40–1.50)
Calcium: 9.1 mg/dL (ref 8.4–10.5)
GFR: 67.53 mL/min (ref >60.00)
GLUCOSE: 77 mg/dL (ref 70–99)
POTASSIUM: 4.9 meq/L (ref 3.5–5.1)
SODIUM: 138 meq/L (ref 135–145)
Total Bilirubin: 0.5 mg/dL (ref 0.2–1.2)
Total Protein: 6.3 g/dL (ref 6.0–8.3)

## 2017-01-18 LAB — PSA: PSA: 0.42 ng/mL (ref 0.10–4.00)

## 2017-01-18 NOTE — Progress Notes (Signed)
Shane Rumps, MD Phone: [phone]  Shane Acevedo is a 84 y.o. male who presents today for follow-up.  Hypertension: Not checking at home.  Is taking the amlodipine.  No chest pain, shortness of breath, or edema.  Reports some nocturia and urinary frequency.  Occurs during the day as well.  Some urgency.  Some dribbling as well.  No straining.  Patient does report a history of hemorrhoids with skin tags in the past.  Notes that the skin tag that is present has been there for a long time and was evaluated by GI previously.  He saw ENT for his issues with swallowing pills.  They placed him on Protonix and this has been helpful.  He notes they found narrowing of the vocal cords.  He does follow up with them in the next several months.  He has a history of compression fracture in his back 5 years ago.  He fell through a ceiling.  He notes rare soreness though no significant pain.  No numbness or weakness.  No incontinence.  Patient does report some anxiety and mild depression.  He is seeing psychiatry.  He has been on Lexapro.  Also on Lamictal.  No SI.  Social History   Tobacco Use  Smoking Status Never Smoker  Smokeless Tobacco Never Used     ROS see history of present illness  Objective  Physical Exam Vitals:   01/18/17 1107  BP: (!) 142/80  Pulse: 63  Temp: 98.3 F (36.8 C)  SpO2: 98%    BP Readings from Last 3 Encounters:  01/18/17 (!) 142/80  11/26/16 140/66  10/05/16 (!) 142/84   Wt Readings from Last 3 Encounters:  01/18/17 160 lb (72.6 kg)  11/26/16 158 lb 8 oz (71.9 kg)  10/05/16 156 lb (70.8 kg)    Physical Exam  Constitutional: No distress.  Cardiovascular: Normal rate, regular rhythm and normal heart sounds.  Pulmonary/Chest: Effort normal and breath sounds normal.  Genitourinary:  Genitourinary Comments: Skin tag from prior hemorrhoid located on rectum, normal rectal tone, mildly enlarged prostate with no nodules  Musculoskeletal: He  exhibits no edema.  No midline spine tenderness, no midline spine step-off  Neurological: He is alert. Gait normal.  Skin: Skin is warm and dry. He is not diaphoretic.     Assessment/Plan: Please see individual problem list.  OSA (obstructive sleep apnea) Patient is willing to undergo home sleep study.  Order placed.  Benign prostatic hyperplasia with nocturia Mild BPH.  No nodules.  Suspect this is the cause of his urinary frequency though we will check a urinalysis.  His IPSS score is 10 with mostly satisfied.  Consider Flomax in the future if he desires.  Anxiety and depression He is seeing psychiatry.  He will continue to see them.  Difficulty swallowing pills Sounds as though this was felt to be reflux.  This has improved with Protonix.  He will follow up with ENT.  History of compression fracture of spine Relatively asymptomatic.  He will continue to monitor.  Hypertension Well-controlled for age.  Continue current medication.   Shane Acevedo was seen today for follow-up.  Diagnoses and all orders for this visit:  Benign prostatic hyperplasia with nocturia -     PSA  Urinary frequency -     POCT Urinalysis Dipstick  Medication monitoring encounter -     Comp Met (CMET)  OSA (obstructive sleep apnea) -     Home sleep test  Anxiety and depression  Difficulty swallowing pills  History of compression fracture of spine  Essential hypertension    Orders Placed This Encounter  Procedures  . PSA  . Comp Met (CMET)  . POCT Urinalysis Dipstick  . Home sleep test    Order Specific Question:   Where should this test be performed:    Answer:   [place]    No orders of the defined types were placed in this encounter.    Shane Rumps, MD [place]

## 2017-01-18 NOTE — Patient Instructions (Signed)
Nice to see you. Please follow-up with your psychiatrist. We will check urine and your prostate today. We will get you set up for a sleep study.

## 2017-01-20 ENCOUNTER — Ambulatory Visit (INDEPENDENT_AMBULATORY_CARE_PROVIDER_SITE_OTHER): Payer: Medicare Other | Admitting: Psychiatry

## 2017-01-20 ENCOUNTER — Encounter: Payer: Self-pay | Admitting: Psychiatry

## 2017-01-20 ENCOUNTER — Other Ambulatory Visit: Payer: Self-pay

## 2017-01-20 VITALS — BP 142/77 | HR 55 | Temp 97.7°F | Wt 157.8 lb

## 2017-01-20 DIAGNOSIS — F411 Generalized anxiety disorder: Secondary | ICD-10-CM | POA: Diagnosis not present

## 2017-01-20 MED ORDER — MIRTAZAPINE 7.5 MG PO TABS: 7.5000 mg | ORAL_TABLET | Freq: Every day | ORAL | 1 refills | Status: DC

## 2017-01-20 MED ORDER — LAMOTRIGINE 200 MG PO TABS: 200.0000 mg | ORAL_TABLET | Freq: Every day | ORAL | 2 refills | Status: DC

## 2017-01-20 MED ORDER — TRAZODONE HCL 50 MG PO TABS: 25.0000 mg | ORAL_TABLET | Freq: Every day | ORAL | 1 refills | Status: DC

## 2017-01-20 NOTE — Patient Instructions (Signed)
Mirtazapine tablets What is this medicine? MIRTAZAPINE (mir TAZ a peen) is used to treat depression. This medicine may be used for other purposes; ask your health care provider or pharmacist if you have questions. COMMON BRAND NAME(S): Remeron What should I tell my health care provider before I take this medicine? They need to know if you have any of these conditions: -bipolar disorder -glaucoma -kidney disease -liver disease -suicidal thoughts -an unusual or allergic reaction to mirtazapine, other medicines, foods, dyes, or preservatives -pregnant or trying to get pregnant -breast-feeding How should I use this medicine? Take this medicine by mouth with a glass of water. Follow the directions on the prescription label. Take your medicine at regular intervals. Do not take your medicine more often than directed. Do not stop taking this medicine suddenly except upon the advice of your doctor. Stopping this medicine too quickly may cause serious side effects or your condition may worsen. A special MedGuide will be given to you by the pharmacist with each prescription and refill. Be sure to read this information carefully each time. Talk to your pediatrician regarding the use of this medicine in children. Special care may be needed. Overdosage: If you think you have taken too much of this medicine contact a poison control center or emergency room at once. NOTE: This medicine is only for you. Do not share this medicine with others. What if I miss a dose? If you miss a dose, take it as soon as you can. If it is almost time for your next dose, take only that dose. Do not take double or extra doses. What may interact with this medicine? Do not take this medicine with any of the following medications: -linezolid -MAOIs like Carbex, Eldepryl, Marplan, Nardil, and Parnate -methylene blue (injected into a vein) This medicine may also interact with the following medications: -alcohol -antiviral  medicines for HIV or AIDS -certain medicines that treat or prevent blood clots like warfarin -certain medicines for depression, anxiety, or psychotic disturbances -certain medicines for fungal infections like ketoconazole and itraconazole -certain medicines for migraine headache like almotriptan, eletriptan, frovatriptan, naratriptan, rizatriptan, sumatriptan, zolmitriptan -certain medicines for seizures like carbamazepine or phenytoin -certain medicines for sleep -cimetidine -erythromycin -fentanyl -lithium -medicines for blood pressure -nefazodone -rasagiline -rifampin -supplements like [place]'s wort, kava kava, valerian -tramadol -tryptophan This list may not describe all possible interactions. Give your health care provider a list of all the medicines, herbs, non-prescription drugs, or dietary supplements you use. Also tell them if you smoke, drink alcohol, or use illegal drugs. Some items may interact with your medicine. What should I watch for while using this medicine? Tell your doctor if your symptoms do not get better or if they get worse. Visit your doctor or health care professional for regular checks on your progress. Because it may take several weeks to see the full effects of this medicine, it is important to continue your treatment as prescribed by your doctor. Patients and their families should watch out for new or worsening thoughts of suicide or depression. Also watch out for sudden changes in feelings such as feeling anxious, agitated, panicky, irritable, hostile, aggressive, impulsive, severely restless, overly excited and hyperactive, or not being able to sleep. If this happens, especially at the beginning of treatment or after a change in dose, call your health care professional. You may get drowsy or dizzy. Do not drive, use machinery, or do anything that needs mental alertness until you know how this medicine affects you. Do not   stand or sit up quickly, especially if  you are an older patient. This reduces the risk of dizzy or fainting spells. Alcohol may interfere with the effect of this medicine. Avoid alcoholic drinks. This medicine may cause dry eyes and blurred vision. If you wear contact lenses you may feel some discomfort. Lubricating drops may help. See your eye doctor if the problem does not go away or is severe. Your mouth may get dry. Chewing sugarless gum or sucking hard candy, and drinking plenty of water may help. Contact your doctor if the problem does not go away or is severe. What side effects may I notice from receiving this medicine? Side effects that you should report to your doctor or health care professional as soon as possible: -allergic reactions like skin rash, itching or hives, swelling of the face, lips, or tongue -anxious -changes in vision -chest pain -confusion -elevated mood, decreased need for sleep, racing thoughts, impulsive behavior -eye pain -fast, irregular heartbeat -feeling faint or lightheaded, falls -feeling agitated, angry, or irritable -fever or chills, sore throat -hallucination, loss of contact with reality -loss of balance or coordination -mouth sores -redness, blistering, peeling or loosening of the skin, including inside the mouth -restlessness, pacing, inability to keep still -seizures -stiff muscles -suicidal thoughts or other mood changes -trouble passing urine or change in the amount of urine -trouble sleeping -unusual bleeding or bruising -unusually weak or tired -vomiting Side effects that usually do not require medical attention (report to your doctor or health care professional if they continue or are bothersome): -change in appetite -constipation -dizziness -dry mouth -muscle aches or pains -nausea -tired -weight gain This list may not describe all possible side effects. Call your doctor for medical advice about side effects. You may report side effects to FDA at 1-800-FDA-1088. Where  should I keep my medicine? Keep out of the reach of children. Store at room temperature between 15 and 30 degrees C (59 and 86 degrees F) Protect from light and moisture. Throw away any unused medicine after the expiration date. NOTE: This sheet is a summary. It may not cover all possible information. If you have questions about this medicine, talk to your doctor, pharmacist, or health care provider.  2018 Elsevier/Gold Standard (2015-05-30 17:30:45)  

## 2017-01-20 NOTE — Progress Notes (Signed)
[facility] MD OP Progress Note  01/21/2017 9:39 AM Dequincy Born  MRN:  [id number]  Chief Complaint: ' I am still not doing well."  Chief Complaint    Follow-up; Medication Refill; Medication Problem     HPI: Shane Acevedo is an 84 year old Caucasian male, who is married, lives in [place], has a history of anxiety as well as mood symptoms, insomnia, presented to the clinic today for a follow-up visit.  Tab today presents as pleasant and talkative.  Jaeven reports he continues to have anxiety symptoms when he feels overwhelmed at times.  He also has some tremors of his bilateral hands.  He reports he is the  primary caretaker of his wife who has been dealing with a lot of medical problems.  Patient also struggles with financial stressors after his recent relocation to [place].  He reports that his current living situation is much more expensive than it used to be and he worries about it a lot.  He reports he is on  Lexapro  5 mg but  he does not think the Lexapro is actually helping.  He reports that his daughters recently came to visit him and she told him that the Lexapro may not work for him since he tried it in the past and it did not work at that time.  He reports that he did not think about that when he was started on the Lexapro and that is why he could not get that information to Probation officer.  He also continues to struggle with some sleep issues.  He reports that he has a lot of work at home towards the end of the day.  He  also spends some quality time with his wife and does some reading and by the time he gets to bed it is close to 12 AM.  He reports he has been taking the trazodone as well as melatonin.  He however reports that his sleep is not very restful.  His dog wakes him up early in the morning and that also contributes to poor sleep.  He has a history of OSA but he does not use his CPAP now.  His primary medical doctor has discussed with him that he will be referred for a sleep study  again.   Continue to take lamotrigine 200 mg which was initiated by his psychiatrist in [place].  He denies any side effects from the same.  He reports he was started on that because they felt he may have been in the bipolar spectrum.  However he wonders whether he needs to be on it.  Denies any significant history of mania or hypomanic symptoms.    Visit Diagnosis:    ICD-10-CM   1. GAD (generalized anxiety disorder) F41.1 mirtazapine (REMERON) 7.5 MG tablet    traZODone (DESYREL) 50 MG tablet    lamoTRIgine (LAMICTAL) 200 MG tablet    Past Psychiatric History: For anxiety and insomnia.  Carried a diagnosis of mood disorder in the past.  He was being treated by Dr. Barbarann Ehlers in [place].  He denies any history of suicide attempts.  He denies any past mental health hospital admissions.  Lexapro in the past-Per his daughter (per patient)-did not work in the past.  He is currently on Lexapro but wants it to be changed to something else.   Past Medical History:  Past Medical History:  Diagnosis Date  . Allergy   . Anxiety   . Depression   . Heart disease   .  Hypertension     Past Surgical History:  Procedure Laterality Date  . CORONARY STENT PLACEMENT Right   . TONSILLECTOMY    . TOTAL HIP ARTHROPLASTY Right     Family Psychiatric History: Father has history of bipolar disorder, sister has paranoid schizophrenia, mother with Alzheimer's dementia, wife has depression  Family History:  Family History  Problem Relation Age of Onset  . Alcoholism Other   . Arthritis Other   . Lung cancer Other   . Mental illness Other   . Bipolar disorder Father   . Schizophrenia Sister    Substance abuse history Denied  Social History: Married.  He has 2 daughters.  He used to work in the [employer], inspected hospitals.  He also did a lot of other work in the past.  He is currently retired.  He moved from [place] to [place] in May 2018 to be closer to his  daughter who currently lives in [place].  He currently lives in a senior living community in [place]. Social History   Socioeconomic History  . Marital status: Married    Spouse name: None  . Number of children: None  . Years of education: None  . Highest education level: None  Social Needs  . Financial resource strain: None  . Food insecurity - worry: None  . Food insecurity - inability: None  . Transportation needs - medical: None  . Transportation needs - non-medical: None  Occupational History  . None  Tobacco Use  . Smoking status: Never Smoker  . Smokeless tobacco: Never Used  Substance and Sexual Activity  . Alcohol use: Yes    Alcohol/week: 2.4 oz    Types: 2 Glasses of wine, 2 Cans of beer per week  . Drug use: No  . Sexual activity: Not Currently  Other Topics Concern  . None  Social History Narrative  . None    Allergies: No Known Allergies  Metabolic Disorder Labs: No results found for: HGBA1C, MPG No results found for: PROLACTIN No results found for: CHOL, TRIG, HDL, CHOLHDL, VLDL, LDLCALC No results found for: TSH  Therapeutic Level Labs: No results found for: LITHIUM No results found for: VALPROATE No components found for:  CBMZ  Current Medications: Current Outpatient Medications  Medication Sig Dispense Refill  . amLODipine (NORVASC) 10 MG tablet Take 10 mg by mouth daily.    Marland Kitchen aspirin EC 81 MG tablet Take 81 mg by mouth daily.    Marland Kitchen atorvastatin (LIPITOR) 10 MG tablet Take 1 tablet (10 mg total) by mouth daily. 90 tablet 3  . lamoTRIgine (LAMICTAL) 200 MG tablet Take 1 tablet (200 mg total) by mouth daily with supper. 30 tablet 2  . Multiple Vitamins-Minerals (CENTRUM SILVER PO) Take by mouth.    Marland Kitchen omeprazole (PRILOSEC) 20 MG capsule Take 20 mg by mouth daily.    . traZODone (DESYREL) 50 MG tablet Take 0.5-1 tablets (25-50 mg total) by mouth at bedtime. 30 tablet 1  . mirtazapine (REMERON) 7.5 MG tablet Take 1 tablet (7.5 mg total) by  mouth at bedtime. 30 tablet 1   No current facility-administered medications for this visit.      Musculoskeletal: Strength & Muscle Tone: within normal limits Gait & Station: normal Patient leans: N/A  Psychiatric Specialty Exam: Review of Systems  Psychiatric/Behavioral: The patient is nervous/anxious and has insomnia.   All other systems reviewed and are negative.   Blood pressure (!) 142/77, pulse (!) 55, temperature 97.7 F (36.5 C),  temperature source Oral, weight 157 lb 12.8 oz (71.6 kg).Body mass index is 23.3 kg/m.  General Appearance: Casual  Eye Contact:  Fair  Speech:  Clear and Coherent  Volume:  Normal  Mood:  Anxious  Affect:  Appropriate  Thought Process:  Goal Directed and Descriptions of Associations: Intact  Orientation:  Full (Time, Place, and Person)  Thought Content: Logical   Suicidal Thoughts:  No  Homicidal Thoughts:  No  Memory:  Immediate;   Fair Recent;   Fair Remote;   Fair  Judgement:  Fair  Insight:  Fair  Psychomotor Activity:  Normal  Concentration:  Concentration: Fair and Attention Span: Fair  Recall:  [employer] of Knowledge: Fair  Language: Good  Akathisia:  No  Handed:  Right  AIMS (if indicated): NA  Assets:  Communication Skills Desire for Improvement Housing Social Support  ADL's:  Intact  Cognition: WNL  Sleep:  impaired   Screenings: PHQ2-9     Office Visit from 07/03/2016 in [facility]  PHQ-2 Total Score  6  PHQ-9 Total Score  12       Assessment and Plan: Miriam is an 84 year old Caucasian male who has a history of anxiety, mood disorder, insomnia, OSA as well as medical problems like coronary artery disease, hypertension, presented to the clinic today for a follow-up visit.  Barton continues to struggle with recent relocation to [facility], health issues, health problems of his wife as well as financial stressors.  Hector however has good coping techniques good social support from his daughter  who lives close by, has access to healthcare and is also involved in a lot of social activities in his community.  Discussed medication changes with patient plan as noted below.  Plan Anxiety Discontinue Lexapro for lack of efficacy.  Patient reports his daughter told him he tried it in the past and it did not work. Continue lamotrigine 200 mg p.o. daily Start Remeron 7.5 mg p.o. Nightly  Insomnia Patient reports a history of OSA.  Patient reports he does not use CPAP now.  His primary medical doctor has discussed with him to refer him for a new sleep study. Remeron 7.5 mg will also help him with his sleep. Reduce trazodone to 25-50 mg p.o. nightly as needed for sleep if Remeron does not help much.  Provided medication education.  Provided supportive psychotherapy.  Follow-up in 2-3 weeks or sooner if needed.  More than 50 % of the time was spent for psychoeducation and supportive psychotherapy and care coordination.  This note was generated in part or whole with voice recognition software. Voice recognition is usually quite accurate but there are transcription errors that can and very often do occur. I apologize for any typographical errors that were not detected and corrected.         Ursula Alert, MD 01/21/2017, 9:39 AM

## 2017-01-21 ENCOUNTER — Encounter: Payer: Self-pay | Admitting: Psychiatry

## 2017-01-22 DIAGNOSIS — N401 Enlarged prostate with lower urinary tract symptoms: Secondary | ICD-10-CM | POA: Insufficient documentation

## 2017-01-22 DIAGNOSIS — R351 Nocturia: Principal | ICD-10-CM

## 2017-01-22 DIAGNOSIS — I10 Essential (primary) hypertension: Secondary | ICD-10-CM | POA: Insufficient documentation

## 2017-01-22 DIAGNOSIS — Z8781 Personal history of (healed) traumatic fracture: Secondary | ICD-10-CM | POA: Insufficient documentation

## 2017-01-22 NOTE — Assessment & Plan Note (Signed)
Sounds as though this was felt to be reflux.  This has improved with Protonix.  He will follow up with ENT.

## 2017-01-22 NOTE — Assessment & Plan Note (Signed)
Well-controlled for age.  Continue current medication.

## 2017-01-22 NOTE — Assessment & Plan Note (Signed)
Mild BPH.  No nodules.  Suspect this is the cause of his urinary frequency though we will check a urinalysis.  His IPSS score is 10 with mostly satisfied.  Consider Flomax in the future if he desires.

## 2017-01-22 NOTE — Assessment & Plan Note (Signed)
He is seeing psychiatry.  He will continue to see them.

## 2017-01-22 NOTE — Assessment & Plan Note (Signed)
Patient is willing to undergo home sleep study.  Order placed.

## 2017-01-22 NOTE — Assessment & Plan Note (Signed)
Relatively asymptomatic.  He will continue to monitor.

## 2017-01-25 ENCOUNTER — Ambulatory Visit: Payer: Self-pay | Admitting: *Deleted

## 2017-01-25 NOTE — Telephone Encounter (Signed)
Patient should be reevaluated for this. Thanks.

## 2017-01-25 NOTE — Telephone Encounter (Signed)
Patient is picking up his Flomax at pharmacy and has been scheduled with Dr. Olivia Mackie on [id number]@ [id number]

## 2017-01-25 NOTE — Telephone Encounter (Signed)
Please advise 

## 2017-01-25 NOTE — Telephone Encounter (Signed)
Patient called for his lab results- he does want to try the Flomax. He wants Dr Josephina Gip to know that since his visit- he has increased pressure on LLQ that is relieved by urination only.It occurs every 2 hours with dull pain if he does not urinate. He wants to know if the Flomax will help this- or does he need to come in again for this?He can be reached at his cell number [phone].  Answer Assessment - Initial Assessment Questions 1. LOCATION: "Where does it hurt?"      LLQ 2. RADIATION: "Does the pain shoot anywhere else?" (e.g., chest, back)     Patient states pain stays in the one area 3. ONSET: "When did the pain begin?" (Minutes, hours or days ago)      Pain has been present- it is a dull pain 4. SUDDEN: "Gradual or sudden onset?"     Gradual- pressure 5. PATTERN "Does the pain come and go, or is it constant?"    - If constant: "Is it getting better, staying the same, or worsening?"      (Note: Constant means the pain never goes away completely; most serious pain is constant and it progresses)     - If intermittent: "How long does it last?" "Do you have pain now?"     (Note: Intermittent means the pain goes away completely between bouts)     Dull pain that is getting more pronounced over time- only relieved with urination- more pressure than pain 6. SEVERITY: "How bad is the pain?"  (e.g., Scale 1-10; mild, moderate, or severe)    - MILD (1-3): doesn't interfere with normal activities, abdomen soft and not tender to touch     - MODERATE (4-7): interferes with normal activities or awakens from sleep, tender to touch     - SEVERE (8-10): excruciating pain, doubled over, unable to do any normal activities       mild 7. RECURRENT SYMPTOM: "Have you ever had this type of abdominal pain before?" If so, ask: "When was the last time?" and "What happened that time?"      Yes- 3 weeks was not present before that 8. CAUSE: "What do you think is causing the abdominal pain?"     Urinary  tract- possible inflamation 9. RELIEVING/AGGRAVATING FACTORS: "What makes it better or worse?" (e.g., movement, antacids, bowel movement)     Urination- not going 10. OTHER SYMPTOMS: "Has there been any vomiting, diarrhea, constipation, or urine problems?"       no  Protocols used: ABDOMINAL PAIN - MALE-A-AH

## 2017-01-28 ENCOUNTER — Ambulatory Visit (INDEPENDENT_AMBULATORY_CARE_PROVIDER_SITE_OTHER): Payer: Medicare Other | Admitting: Internal Medicine

## 2017-01-28 ENCOUNTER — Encounter: Payer: Self-pay | Admitting: Internal Medicine

## 2017-01-28 VITALS — BP 122/66 | HR 62 | Temp 98.4°F | Resp 16 | Ht 69.0 in | Wt 158.0 lb

## 2017-01-28 DIAGNOSIS — G4733 Obstructive sleep apnea (adult) (pediatric): Secondary | ICD-10-CM | POA: Diagnosis not present

## 2017-01-28 DIAGNOSIS — N401 Enlarged prostate with lower urinary tract symptoms: Secondary | ICD-10-CM | POA: Diagnosis not present

## 2017-01-28 DIAGNOSIS — R0602 Shortness of breath: Secondary | ICD-10-CM | POA: Diagnosis not present

## 2017-01-28 DIAGNOSIS — I25118 Atherosclerotic heart disease of native coronary artery with other forms of angina pectoris: Secondary | ICD-10-CM

## 2017-01-28 DIAGNOSIS — R011 Cardiac murmur, unspecified: Secondary | ICD-10-CM

## 2017-01-28 DIAGNOSIS — R1032 Left lower quadrant pain: Secondary | ICD-10-CM | POA: Insufficient documentation

## 2017-01-28 DIAGNOSIS — R1030 Lower abdominal pain, unspecified: Secondary | ICD-10-CM

## 2017-01-28 DIAGNOSIS — N3281 Overactive bladder: Secondary | ICD-10-CM

## 2017-01-28 DIAGNOSIS — I6523 Occlusion and stenosis of bilateral carotid arteries: Secondary | ICD-10-CM

## 2017-01-28 DIAGNOSIS — I6529 Occlusion and stenosis of unspecified carotid artery: Secondary | ICD-10-CM | POA: Insufficient documentation

## 2017-01-28 DIAGNOSIS — R350 Frequency of micturition: Secondary | ICD-10-CM

## 2017-01-28 LAB — CBC WITH DIFFERENTIAL/PLATELET
BASOS ABS: 0.1 10*3/uL (ref 0.0–0.1)
Basophils Relative: 0.8 % (ref 0.0–3.0)
EOS PCT: 0.8 % (ref 0.0–5.0)
Eosinophils Absolute: 0.1 10*3/uL (ref 0.0–0.7)
HCT: 43.4 % (ref 39.0–52.0)
HEMOGLOBIN: 14.4 g/dL (ref 13.0–17.0)
LYMPHS ABS: 1.9 10*3/uL (ref 0.7–4.0)
LYMPHS PCT: 26.4 % (ref 12.0–46.0)
MCHC: 33.1 g/dL (ref 30.0–36.0)
MCV: 92.9 fl (ref 78.0–100.0)
Monocytes Absolute: 0.7 10*3/uL (ref 0.1–1.0)
Monocytes Relative: 10.2 % (ref 3.0–12.0)
NEUTROS PCT: 61.8 % (ref 43.0–77.0)
Neutro Abs: 4.3 10*3/uL (ref 1.4–7.7)
Platelets: 239 10*3/uL (ref 150.0–400.0)
RBC: 4.67 Mil/uL (ref 4.22–5.81)
RDW: 13.6 % (ref 11.5–15.5)
WBC: 7 10*3/uL (ref 4.0–10.5)

## 2017-01-28 LAB — URINALYSIS, ROUTINE W REFLEX MICROSCOPIC
BILIRUBIN URINE: NEGATIVE
HGB URINE DIPSTICK: NEGATIVE
Ketones, ur: NEGATIVE
LEUKOCYTES UA: NEGATIVE
NITRITE: NEGATIVE
RBC / HPF: NONE SEEN (ref 0–?)
Specific Gravity, Urine: 1.02 (ref 1.000–1.030)
TOTAL PROTEIN, URINE-UPE24: NEGATIVE
UROBILINOGEN UA: 0.2 (ref 0.0–1.0)
Urine Glucose: NEGATIVE
pH: 6 (ref 5.0–8.0)

## 2017-01-28 MED ORDER — TAMSULOSIN HCL 0.4 MG PO CAPS: 0.4000 mg | ORAL_CAPSULE | Freq: Every day | ORAL | 1 refills | Status: DC

## 2017-01-28 MED ORDER — OXYBUTYNIN CHLORIDE ER 5 MG PO TB24: 5.0000 mg | ORAL_TABLET | Freq: Every day | ORAL | 1 refills | Status: DC

## 2017-01-28 NOTE — Progress Notes (Signed)
Chief Complaint  Patient presents with  . Abdominal Pain   F/u  1. C/o lower ab pain/pressure esp LLQ and increased urinary freq and urgency and leakage. Urinating relives pressure. He was having ab pressure 3-4 x per day and/or every 4 hours. Pressure today is 3-4/10, denies GU blood. 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 Colonoscopies negative per pt w/o h/o diverticulosis. He reports his bladder is overactive, denies nocturia but will wake up around 5 am to use restroom and go again 1-2 hours later.  He has never been on flomax for bph sx's prev.   2. C/o anxiety since moving here and primary caretaker of wife    Review of Systems  Constitutional: Negative for weight loss.  HENT: Negative for hearing loss.   Respiratory: Negative for shortness of breath.   Cardiovascular: Negative for chest pain.  Gastrointestinal: Positive for abdominal pain. Negative for blood in stool, constipation and diarrhea.  Genitourinary: Positive for frequency and urgency. Negative for dysuria.       +overactive bladder with leakage   Musculoskeletal: Negative for falls.  Skin: Negative for rash.  Neurological: Negative for headaches.  Psychiatric/Behavioral: The patient is nervous/anxious.    Past Medical History:  Diagnosis Date  . Allergy   . Anxiety   . Depression   . Heart disease   . Hypertension    Past Surgical History:  Procedure Laterality Date  . CORONARY STENT PLACEMENT Right   . TONSILLECTOMY    . TOTAL HIP ARTHROPLASTY Right    Family History  Problem Relation Age of Onset  . Alcoholism Other   . Arthritis Other   . Lung cancer Other   . Mental illness Other   . Bipolar disorder Father   . Schizophrenia Sister    Social History   Socioeconomic History  . Marital status: Married    Spouse name: Not on file  . Number of children: Not on file  . Years of education: Not on file  . Highest education level: Not on file  Social Needs  . Financial resource strain: Not on file   . Food insecurity - worry: Not on file  . Food insecurity - inability: Not on file  . Transportation needs - medical: Not on file  . Transportation needs - non-medical: Not on file  Occupational History  . Not on file  Tobacco Use  . Smoking status: Never Smoker  . Smokeless tobacco: Never Used  Substance and Sexual Activity  . Alcohol use: Yes    Alcohol/week: 2.4 oz    Types: 2 Glasses of wine, 2 Cans of beer per week  . Drug use: No  . Sexual activity: Not Currently  Other Topics Concern  . Not on file  Social History Narrative  . Not on file   Current Meds  Medication Sig  . amLODipine (NORVASC) 10 MG tablet Take 10 mg by mouth daily.  Marland Kitchen aspirin EC 81 MG tablet Take 81 mg by mouth daily.  Marland Kitchen atorvastatin (LIPITOR) 10 MG tablet Take 1 tablet (10 mg total) by mouth daily.  Marland Kitchen lamoTRIgine (LAMICTAL) 200 MG tablet Take 1 tablet (200 mg total) by mouth daily with supper.  . mirtazapine (REMERON) 7.5 MG tablet Take 1 tablet (7.5 mg total) by mouth at bedtime.  . Multiple Vitamins-Minerals (CENTRUM SILVER PO) Take by mouth.  Marland Kitchen omeprazole (PRILOSEC) 20 MG capsule Take 20 mg by mouth daily.  . traZODone (DESYREL) 50 MG tablet Take 0.5-1 tablets (25-50 mg total) by  mouth at bedtime.   No Known Allergies Recent Results (from the past 2160 hour(s))  PSA     Status: None   Collection Time: 01/18/17 11:44 AM  Result Value Ref Range   PSA 0.42 0.10 - 4.00 ng/mL    Comment: Test performed using Access Hybritech PSA Assay, a parmagnetic partical, chemiluminecent immunoassay.  Comp Met (CMET)     Status: None   Collection Time: 01/18/17 11:44 AM  Result Value Ref Range   Sodium 138 135 - 145 mEq/L   Potassium 4.9 3.5 - 5.1 mEq/L   Chloride 104 96 - 112 mEq/L   CO2 27 19 - 32 mEq/L   Glucose, Bld 77 70 - 99 mg/dL   BUN 20 6 - 23 mg/dL   Creatinine, Ser 1.11 0.40 - 1.50 mg/dL   Total Bilirubin 0.5 0.2 - 1.2 mg/dL   Alkaline Phosphatase 63 39 - 117 U/L   AST 12 0 - 37 U/L   ALT 12 0  - 53 U/L   Total Protein 6.3 6.0 - 8.3 g/dL   Albumin 4.0 3.5 - 5.2 g/dL   Calcium 9.1 8.4 - 10.5 mg/dL   GFR 67.53 >60.00 mL/min  POCT Urinalysis Dipstick     Status: Normal   Collection Time: 01/18/17  2:19 PM  Result Value Ref Range   Color, UA yellow    Clarity, UA clear    Glucose, UA neg    Bilirubin, UA neg    Ketones, UA neg    Spec Grav, UA 1.010 1.010 - 1.025   Blood, UA neg    pH, UA 5.0 5.0 - 8.0   Protein, UA neg    Urobilinogen, UA 0.2 0.2 or 1.0 E.U./dL   Nitrite, UA neg    Leukocytes, UA Negative Negative   Appearance     Odor     Objective  Body mass index is 23.33 kg/m. Wt Readings from Last 3 Encounters:  01/28/17 158 lb (71.7 kg)  01/18/17 160 lb (72.6 kg)  11/26/16 158 lb 8 oz (71.9 kg)   Temp Readings from Last 3 Encounters:  01/28/17 98.4 F (36.9 C) (Oral)  01/18/17 98.3 F (36.8 C) (Oral)  10/05/16 97.9 F (36.6 C) (Oral)   BP Readings from Last 3 Encounters:  01/28/17 122/66  01/18/17 (!) 142/80  11/26/16 140/66   Pulse Readings from Last 3 Encounters:  01/28/17 62  01/18/17 63  11/26/16 (!) 51   O2 sat room air 96%  Physical Exam  Constitutional: He is oriented to person, place, and time and well-developed, well-nourished, and in no distress. Vital signs are normal.  HENT:  Head: Normocephalic and atraumatic.  Mouth/Throat: Oropharynx is clear and moist and mucous membranes are normal.  Eyes: Conjunctivae are normal. Pupils are equal, round, and reactive to light.  Cardiovascular: Normal rate and regular rhythm.  Murmur heard. Pulmonary/Chest: Effort normal and breath sounds normal.  Abdominal: Soft. Bowel sounds are normal. There is tenderness in the suprapubic area. There is no rebound and no CVA tenderness.  Neg ttp on LLQ on exam today   Neurological: He is alert and oriented to person, place, and time. Gait normal. Gait normal.  Skin: Skin is warm and dry.  Psychiatric: Mood, memory, affect and judgment normal.  Nursing  note and vitals reviewed.   Assessment   1. LLQ/suprapubic ab pressure ddx diverticulitis, ? If BPH sx vs overactive bladder/freq/urgency vs other. Prostate score 15 today for BPH xs  2. Anxiety  3.  Cardiac murmur, CAD s/p stent, CAS Plan  1. Had labs 01/18/2017 CMET, PSA, UA normal  Will repeat UA and culture today and do CBC will also order CT ab/pelvis with contrast to evaluate  Trial of flomax and oxybutynin 5 mg ER qhs disc myrbetriq but cost is of concern   2. Assess with PCP and see if needs to add medication for anxiety in future   3. F/u Dr. Rockey Situ Needs lipid check when fasting  Cont meds.   Provider: Dr. Olivia Mackie [facility]

## 2017-01-28 NOTE — Patient Instructions (Addendum)
Follow up in 2 weeks sooner if needed  We will check urine and blood today and order CT abdomin/pelvis at [facility]  Take flomax and Oxybutyinin at night  Take care   Overactive Bladder, Adult Overactive bladder is a group of urinary symptoms. With overactive bladder, you may suddenly feel the need to pass urine (urinate) right away. After feeling this sudden urge, you might also leak urine if you cannot get to the bathroom fast enough (urinary incontinence). These symptoms might interfere with your daily work or social activities. Overactive bladder symptoms may also wake you up at night. Overactive bladder affects the nerve signals between your bladder and your brain. Your bladder may get the signal to empty before it is full. Very sensitive muscles can also make your bladder squeeze too soon. What are the causes? Many things can cause an overactive bladder. Possible causes include:  Urinary tract infection.  Infection of nearby tissues, such as the prostate.  Prostate enlargement.  Being pregnant with twins or more (multiples).  Surgery on the uterus or urethra.  Bladder stones, inflammation, or tumors.  Drinking too much caffeine or alcohol.  Certain medicines, especially those that you take to help your body get rid of extra fluid (diuretics) by increasing urine production.  Muscle or nerve weakness, especially from: ? A spinal cord injury. ? Stroke. ? Multiple sclerosis. ? Parkinson disease.  Diabetes. This can cause a high urine volume that fills the bladder so quickly that the normal urge to urinate is triggered very strongly.  Constipation. A buildup of too much stool can put pressure on your bladder.  What increases the risk? You may be at greater risk for overactive bladder if you:  Are an older adult.  Smoke.  Are going through menopause.  Have prostate problems.  Have a neurological disease, such as stroke, dementia, Parkinson disease, or multiple  sclerosis (MS).  Eat or drink things that irritate the bladder. These include alcohol, spicy food, and caffeine.  Are overweight or obese.  What are the signs or symptoms? The signs and symptoms of an overactive bladder include:  Sudden, strong urges to urinate.  Leaking urine.  Urinating eight or more times per day.  Waking up to urinate two or more times per night.  How is this diagnosed? Your health care provider may suspect overactive bladder based on your symptoms. The health care provider will do a physical exam and take your medical history. Blood or urine tests may also be done. For example, you might need to have a bladder function test to check how well you can hold your urine. You might also need to see a health care provider who specializes in the urinary tract (urologist). How is this treated? Treatment for overactive bladder depends on the cause of your condition and whether it is mild or severe. Certain treatments can be done in your health care provider's office or clinic. You can also make lifestyle changes at home. Options include: Behavioral Treatments  Biofeedback. A specialist uses sensors to help you become aware of your body's signals.  Keeping a daily log of when you need to urinate and what happens after the urge. This may help you manage your condition.  Bladder training. This helps you learn to control the urge to urinate by following a schedule that directs you to urinate at regular intervals (timed voiding). At first, you might have to wait a few minutes after feeling the urge. In time, you should be able to  schedule bathroom visits an hour or more apart.  Kegel exercises. These are exercises to strengthen the pelvic floor muscles, which support the bladder. Toning these muscles can help you control urination, even if your bladder muscles are overactive. A specialist will teach you how to do these exercises correctly. They require daily practice.  Weight  loss. If you are obese or overweight, losing weight might relieve your symptoms of overactive bladder. Talk to your health care provider about losing weight and whether there is a specific program or method that would work best for you.  Diet change. This might help if constipation is making your overactive bladder worse. Your health care provider or a dietitian can explain ways to change what you eat to ease constipation. You might also need to consume less alcohol and caffeine or drink other fluids at different times of the day.  Stopping smoking.  Wearing pads to absorb leakage while you wait for other treatments to take effect. Physical Treatments  Electrical stimulation. Electrodes send gentle pulses of electricity to strengthen the nerves or muscles that help to control the bladder. Sometimes, the electrodes are placed outside of the body. In other cases, they might be placed inside the body (implanted). This treatment can take several months to have an effect.  Supportive devices. Women may need a plastic device that fits into the vagina and supports the bladder (pessary). Medicines Several medicines can help treat overactive bladder and are usually used along with other treatments. Some are injected into the muscles involved in urination. Others come in pill form. Your health care provider may prescribe:  Antispasmodics. These medicines block the signals that the nerves send to the bladder. This keeps the bladder from releasing urine at the wrong time.  Tricyclic antidepressants. These types of antidepressants also relax bladder muscles.  Surgery  You may have a device implanted to help manage the nerve signals that indicate when you need to urinate.  You may have surgery to implant electrodes for electrical stimulation.  Sometimes, very severe cases of overactive bladder require surgery to change the shape of the bladder. Follow these instructions at home:  Take medicines only as  directed by your health care provider.  Use any implants or a pessary as directed by your health care provider.  Make any diet or lifestyle changes that are recommended by your health care provider. These might include: ? Drinking less fluid or drinking at different times of the day. If you need to urinate often during the night, you may need to stop drinking fluids early in the evening. ? Cutting down on caffeine or alcohol. Both can make an overactive bladder worse. Caffeine is found in coffee, tea, and sodas. ? Doing Kegel exercises to strengthen muscles. ? Losing weight if you need to. ? Eating a healthy and balanced diet to prevent constipation.  Keep a journal or log to track how much and when you drink and also when you feel the need to urinate. This will help your health care provider to monitor your condition. Contact a health care provider if:  Your symptoms do not get better after treatment.  Your pain and discomfort are getting worse.  You have more frequent urges to urinate.  You have a fever. Get help right away if: You are not able to control your bladder at all. This information is not intended to replace advice given to you by your health care provider. Make sure you discuss any questions you have with your health  care provider. Document Released: 10/25/2008 Document Revised: 06/06/2015 Document Reviewed: 05/24/2013 Elsevier Interactive Patient Education  2018 [employer].  Abdominal Pain, Adult Abdominal pain can be caused by many things. Often, abdominal pain is not serious and it gets better with no treatment or by being treated at home. However, sometimes abdominal pain is serious. Your health care provider will do a medical history and a physical exam to try to determine the cause of your abdominal pain. Follow these instructions at home:  Take over-the-counter and prescription medicines only as told by your health care provider. Do not take a laxative unless  told by your health care provider.  Drink enough fluid to keep your urine clear or pale yellow.  Watch your condition for any changes.  Keep all follow-up visits as told by your health care provider. This is important. Contact a health care provider if:  Your abdominal pain changes or gets worse.  You are not hungry or you lose weight without trying.  You are constipated or have diarrhea for more than 2-3 days.  You have pain when you urinate or have a bowel movement.  Your abdominal pain wakes you up at night.  Your pain gets worse with meals, after eating, or with certain foods.  You are throwing up and cannot keep anything down.  You have a fever. Get help right away if:  Your pain does not go away as soon as your health care provider told you to expect.  You cannot stop throwing up.  Your pain is only in areas of the abdomen, such as the right side or the left lower portion of the abdomen.  You have bloody or black stools, or stools that look like tar.  You have severe pain, cramping, or bloating in your abdomen.  You have signs of dehydration, such as: ? Dark urine, very little urine, or no urine. ? Cracked lips. ? Dry mouth. ? Sunken eyes. ? Sleepiness. ? Weakness. This information is not intended to replace advice given to you by your health care provider. Make sure you discuss any questions you have with your health care provider. Document Released: 10/08/2004 Document Revised: 07/19/2015 Document Reviewed: 06/12/2015 Elsevier Interactive Patient Education  [employer].

## 2017-01-29 DIAGNOSIS — G4733 Obstructive sleep apnea (adult) (pediatric): Secondary | ICD-10-CM | POA: Diagnosis not present

## 2017-01-29 DIAGNOSIS — R0602 Shortness of breath: Secondary | ICD-10-CM | POA: Diagnosis not present

## 2017-01-29 LAB — URINE CULTURE
MICRO NUMBER: 90072283
Result:: NO GROWTH
SPECIMEN QUALITY: ADEQUATE

## 2017-02-01 ENCOUNTER — Telehealth: Payer: Self-pay | Admitting: Family Medicine

## 2017-02-01 ENCOUNTER — Telehealth: Payer: Self-pay

## 2017-02-01 NOTE — Telephone Encounter (Signed)
Copied from [place] [id number]. Topic: Referral - Status >> Feb 01, 2017 12:22 PM Micheline Maze B wrote: Reason for CRM: received message that pt was inquiring about CT that Dr. Olivia Mackie had ordered for him. Tried calling him but mailbox is full and not able to leave message. Please give him the information when/if he calls back. He is scheduled on Wednesday Jan. 30 at 9:30 am. He will need to arrive by 9:15 am for check in. He can not have anything to eat or drink after midnight. He will need to pick up his prep kit (contrast drink) at least one day prior to this appointment along with instructions on when to drink it. This will be done at [facility]. [place]. If he needs to reschedule please give him [phone] to reschedule.

## 2017-02-01 NOTE — Telephone Encounter (Signed)
FYI

## 2017-02-01 NOTE — Telephone Encounter (Unsigned)
Copied from [place] [phone]. Topic: Inquiry >> Feb 01, 2017 10:15 AM Neva Seat wrote: Pt has an abdominal CT scan ordered by Dr. Aundra Dubin Bevelyn Buckles.  Pt is checking on the status - the appt. Was made.  Please call pt asap.

## 2017-02-09 ENCOUNTER — Other Ambulatory Visit: Payer: Self-pay | Admitting: Family Medicine

## 2017-02-09 NOTE — Telephone Encounter (Signed)
Please advise 

## 2017-02-09 NOTE — Telephone Encounter (Signed)
Request for refill of amlodipine and omeprazole. Cannot find OV that addresses meds/issues and no ordering physician found that ordered meds. Has upcoming OV 02/15/17.

## 2017-02-09 NOTE — Telephone Encounter (Signed)
Copied from [place] [phone]. Topic: Quick Communication - See Telephone Encounter >> Feb 09, 2017 12:52 PM Hewitt Shorts wrote: CRM for notification. See Telephone encounter for:  Pt is needing a refill on amlodipine and also omeprazole [facility]  Best  number [phone]  02/09/17.

## 2017-02-10 ENCOUNTER — Ambulatory Visit
Admission: RE | Admit: 2017-02-10 | Discharge: 2017-02-10 | Disposition: A | Discharge status: Home / Self Care | Payer: Medicare Other | Source: Ambulatory Visit | Attending: Internal Medicine | Admitting: Internal Medicine

## 2017-02-10 DIAGNOSIS — R1030 Lower abdominal pain, unspecified: Secondary | ICD-10-CM | POA: Diagnosis not present

## 2017-02-10 DIAGNOSIS — R1032 Left lower quadrant pain: Secondary | ICD-10-CM | POA: Diagnosis not present

## 2017-02-10 DIAGNOSIS — I700 Atherosclerosis of aorta: Secondary | ICD-10-CM | POA: Insufficient documentation

## 2017-02-10 DIAGNOSIS — M438X4 Other specified deforming dorsopathies, thoracic region: Secondary | ICD-10-CM | POA: Insufficient documentation

## 2017-02-10 DIAGNOSIS — I2510 Atherosclerotic heart disease of native coronary artery without angina pectoris: Secondary | ICD-10-CM | POA: Insufficient documentation

## 2017-02-10 MED ORDER — OMEPRAZOLE 20 MG PO CPDR: 20.0000 mg | DELAYED_RELEASE_CAPSULE | Freq: Every day | ORAL | 1 refills | Status: DC

## 2017-02-10 MED ORDER — IOPAMIDOL (ISOVUE-300) INJECTION 61%
100.0000 mL | Freq: Once | INTRAVENOUS | Status: AC | PRN
  Administered 2017-02-10: 100 mL via INTRAVENOUS

## 2017-02-10 MED ORDER — AMLODIPINE BESYLATE 10 MG PO TABS: 10.0000 mg | ORAL_TABLET | Freq: Every day | ORAL | 3 refills | Status: DC

## 2017-02-11 ENCOUNTER — Ambulatory Visit (INDEPENDENT_AMBULATORY_CARE_PROVIDER_SITE_OTHER): Payer: Medicare Other | Admitting: Psychiatry

## 2017-02-11 ENCOUNTER — Encounter: Payer: Self-pay | Admitting: Psychiatry

## 2017-02-11 ENCOUNTER — Other Ambulatory Visit: Payer: Self-pay

## 2017-02-11 VITALS — BP 123/76 | HR 65 | Temp 98.1°F | Wt 158.8 lb

## 2017-02-11 DIAGNOSIS — I6523 Occlusion and stenosis of bilateral carotid arteries: Secondary | ICD-10-CM

## 2017-02-11 DIAGNOSIS — F411 Generalized anxiety disorder: Secondary | ICD-10-CM

## 2017-02-11 NOTE — Progress Notes (Signed)
[facility] MD OP Progress Note  02/11/2017 3:52 PM Shane Acevedo  MRN:  [id number]  Chief Complaint: ' I am ok."  Chief Complaint    Follow-up; Medication Refill     HPI: Shane Acevedo is an 84 year old Caucasian male, married, lives in [place], has a history of anxiety as well as mood symptoms, insomnia, presented to the clinic today for a follow-up visit.  Ramadan today reports that he has been doing okay.  He reports he had  doctor's appointments as well as imaging done because of some urinary problems.  He reports he was recently started on Flomax as well as oxybutynin.  He reports he has a primary care provider appointment on Monday and he looks forward to that.  He reports he has been taking the Remeron 7.5 mg at bedtime.  He reports that has been keeping him less anxious and also helps with his sleep.  He however reports some dizziness in the morning.  He however does not know if it started after the Remeron or after the other medications.  We discussed with him that it can be a combination of medication which could be causing it or it could be  the Flomax or another medication which also can cause orthostatic hypotension and dizziness.  Discussed with patient to give some time gap between taking the Remeron and the Flomax.  He reports he takes  Flomax only with supper and Remeron at bedtime.  Also discussed with him to divide the Lamictal into 2 doses, he will take 100 mg in the morning and 100 in the evening.  He reports he has a lot of medications - 200 mg tab left and he will try to do that himself  and see how he feels.  He reports he has been busy doing a lot of projects.  He reports he is taking care of the income tax returns as well as is taking care of his wife's family trust.  He also spends time with his grandchildren in [place].  He also has been going to some group activities at the senior living community where he lives.  He reports he also has been trying to read books.  He reports he  has been reading 'All the Kingsmen', recently and enjoys it.  He denies any other concerns today.  Visit Diagnosis:    ICD-10-CM   1. GAD (generalized anxiety disorder) F41.1     Past Psychiatric History: History of anxiety and insomnia.  Carried a diagnosis of mood disorder in the past.  He was being treated by Dr. Barbarann Ehlers in [place].  He denies any history of suicide attempts.  He denies any past mental health hospital admissions. He  tried  Lexapro in the past per his daughter-did not (work in the past).    Past Medical History:  Past Medical History:  Diagnosis Date  . Allergy   . Anxiety   . Depression   . Heart disease   . Hypertension     Past Surgical History:  Procedure Laterality Date  . CORONARY STENT PLACEMENT Right   . TONSILLECTOMY    . TOTAL HIP ARTHROPLASTY Right     Family Psychiatric History: Father has history of bipolar disorder, sister has paranoid schizophrenia, mother with Alzheimer's dementia, wife has depression.  Family History:  Family History  Problem Relation Age of Onset  . Alcoholism Other   . Arthritis Other   . Lung cancer Other   . Mental illness Other   . Bipolar  disorder Father   . Schizophrenia Sister    Substance abuse history: Denies   Social History: Married.  He has 2 daughters.  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  He also did a lot of other work in the past.  He is currently retired.  He moved from [place] to [place] in May 2018 to be closer to his daughter who currently lives in [place].  He currently lives in a senior living community in [place]. Social History   Socioeconomic History  . Marital status: Married    Spouse name: None  . Number of children: None  . Years of education: None  . Highest education level: None  Social Needs  . Financial resource strain: None  . Food insecurity - worry: None  . Food insecurity - inability: None  . Transportation  needs - medical: None  . Transportation needs - non-medical: None  Occupational History  . None  Tobacco Use  . Smoking status: Never Smoker  . Smokeless tobacco: Never Used  Substance and Sexual Activity  . Alcohol use: Yes    Alcohol/week: 2.4 oz    Types: 2 Glasses of wine, 2 Cans of beer per week  . Drug use: No  . Sexual activity: Not Currently  Other Topics Concern  . None  Social History Narrative  . None    Allergies: No Known Allergies  Metabolic Disorder Labs: No results found for: HGBA1C, MPG No results found for: PROLACTIN No results found for: CHOL, TRIG, HDL, CHOLHDL, VLDL, LDLCALC No results found for: TSH  Therapeutic Level Labs: No results found for: LITHIUM No results found for: VALPROATE No components found for:  CBMZ  Current Medications: Current Outpatient Medications  Medication Sig Dispense Refill  . amLODipine (NORVASC) 10 MG tablet Take 1 tablet (10 mg total) by mouth daily. 90 tablet 3  . aspirin EC 81 MG tablet Take 81 mg by mouth daily.    Marland Kitchen atorvastatin (LIPITOR) 10 MG tablet Take 1 tablet (10 mg total) by mouth daily. 90 tablet 3  . lamoTRIgine (LAMICTAL) 200 MG tablet Take 1 tablet (200 mg total) by mouth daily with supper. 30 tablet 2  . mirtazapine (REMERON) 7.5 MG tablet Take 1 tablet (7.5 mg total) by mouth at bedtime. 30 tablet 1  . Multiple Vitamins-Minerals (CENTRUM SILVER PO) Take by mouth.    Marland Kitchen omeprazole (PRILOSEC) 20 MG capsule Take 1 capsule (20 mg total) by mouth daily. 90 capsule 1  . oxybutynin (DITROPAN-XL) 5 MG 24 hr tablet Take 1 tablet (5 mg total) by mouth at bedtime. 30 tablet 1  . tamsulosin (FLOMAX) 0.4 MG CAPS capsule Take 1 capsule (0.4 mg total) by mouth daily after supper. 30 capsule 1   No current facility-administered medications for this visit.      Musculoskeletal: Strength & Muscle Tone: within normal limits Gait & Station: normal Patient leans: N/A  Psychiatric Specialty Exam: Review of Systems   Psychiatric/Behavioral: The patient is nervous/anxious and has insomnia (improving).   All other systems reviewed and are negative.   Blood pressure 123/76, pulse 65, temperature 98.1 F (36.7 C), temperature source Oral, weight 158 lb 12.8 oz (72 kg).Body mass index is 23.45 kg/m.  General Appearance: Casual  Eye Contact:  Fair  Speech:  Normal Rate  Volume:  Normal  Mood:  Euthymic  Affect:  Congruent  Thought Process:  Goal Directed and Descriptions of Associations: Intact  Orientation:  Full (Time, Place, and  Person)  Thought Content: Logical   Suicidal Thoughts:  No  Homicidal Thoughts:  No  Memory:  Immediate;   Fair Recent;   Fair Remote;   Fair  Judgement:  Fair  Insight:  Fair  Psychomotor Activity:  Normal  Concentration:  Concentration: Fair and Attention Span: Fair  Recall:  [employer] of Knowledge: Fair  Language: Fair  Akathisia:  No  Handed:  Right  AIMS (if indicated): NA  Assets:  Communication Skills Desire for Improvement Social Support  ADL's:  Intact  Cognition: WNL  Sleep:  improving   Screenings: PHQ2-9     Office Visit from 07/03/2016 in [facility]  PHQ-2 Total Score  6  PHQ-9 Total Score  12       Assessment and Plan: Shane Acevedo is an 84 year old Caucasian male who has a history of anxiety, mood disorder, insomnia, OSA as well as medical problems like coronary artery disease, hypertension, presented to the clinic today for a follow-up visit.  Shane Acevedo continues to struggle with stressors like relocation to [facility], health issues, health problems of his wife, financial issues and so on.  He does have good social support and currently lives in a senior living community.  He is however interested in psychotherapy.  We will refer him for therapy here in clinic.  Discussed medication changes as noted below.  Plan For anxiety Continue Remeron 7.5 mg p.o. nightly. Continue lamotrigine, but in divided doses 100 mg p.o. twice  daily.  He reports he will do it himself.  Insomnia The patient reports he has a history of OSA.  Patient also reports he had a recent sleep study done, awaiting results.  Discussed with him to let writer know about his results. Continue Remeron 7.5 mg p.o. nightly. He reports he does not take the trazodone anymore.  Refer for CBT with Ms. Peacock here in clinic.  Discussed with him to discuss with Dr. [name] about his dizziness, unknown if one of his new medications are causing it.  More than 50 % of the time was spent for psychoeducation and supportive psychotherapy and care coordination.  This note was generated in part or whole with voice recognition software. Voice recognition is usually quite accurate but there are transcription errors that can and very often do occur. I apologize for any typographical errors that were not detected and corrected.          Ursula Alert, MD 02/11/2017, 3:52 PM

## 2017-02-12 ENCOUNTER — Encounter: Payer: Self-pay | Admitting: Psychiatry

## 2017-02-15 ENCOUNTER — Encounter: Payer: Self-pay | Admitting: Family Medicine

## 2017-02-15 ENCOUNTER — Ambulatory Visit (INDEPENDENT_AMBULATORY_CARE_PROVIDER_SITE_OTHER): Payer: Medicare Other | Admitting: Family Medicine

## 2017-02-15 VITALS — BP 148/70 | HR 59 | Temp 98.4°F | Wt 162.2 lb

## 2017-02-15 DIAGNOSIS — I700 Atherosclerosis of aorta: Secondary | ICD-10-CM | POA: Diagnosis not present

## 2017-02-15 DIAGNOSIS — I6523 Occlusion and stenosis of bilateral carotid arteries: Secondary | ICD-10-CM | POA: Diagnosis not present

## 2017-02-15 DIAGNOSIS — N401 Enlarged prostate with lower urinary tract symptoms: Secondary | ICD-10-CM | POA: Diagnosis not present

## 2017-02-15 DIAGNOSIS — R351 Nocturia: Secondary | ICD-10-CM | POA: Diagnosis not present

## 2017-02-15 DIAGNOSIS — Q891 Congenital malformations of adrenal gland: Secondary | ICD-10-CM | POA: Diagnosis not present

## 2017-02-15 DIAGNOSIS — R350 Frequency of micturition: Secondary | ICD-10-CM

## 2017-02-15 MED ORDER — DEXAMETHASONE 1 MG PO TABS: 1.0000 mg | ORAL_TABLET | Freq: Once | ORAL | 0 refills | Status: AC

## 2017-02-15 NOTE — Patient Instructions (Signed)
Nice to see you. We will have you return for lab work. We will try to track down your sleep study. We will refer you to urology as well.

## 2017-02-15 NOTE — Assessment & Plan Note (Signed)
Symptoms consistent with BPH.  We will go ahead and refer to urology to determine a more appropriate treatment regimen.  He will continue Ditropan for now.  Discussed potential side effects of this medication particularly dry mouth.

## 2017-02-15 NOTE — Assessment & Plan Note (Signed)
Incidentally noted on CT scan.  Discussed risks and benefits of daily aspirin.  Encourage patient to start on this given the likely benefits and lack of bleeding issues with his current dosing.  He will continue Lipitor.  Monitor for symptoms.

## 2017-02-15 NOTE — Assessment & Plan Note (Signed)
Noted on CT scan incidentally.  Discussed need to ensure that this is not overly active.  Lab orders placed.  Discussed dexamethasone suppression test and taking dexamethasone at 11 PM the night before his lab test.

## 2017-02-15 NOTE — Progress Notes (Signed)
[name], MD Phone: [phone]  Makhi Muzquiz is a 84 y.o. male who presents today for follow-up.  Patient previously seen by Dr. Terese Door for lower abdominal discomfort.  He was started on Flomax and Ditropan.  He got lightheaded with the Flomax and discontinued that and the lightheadedness has improved.  He had an elevated IPSS score.  He notes urinary frequency.  The lower abdominal discomfort resolves with urination.  No dysuria.  No vomiting or diarrhea.  Some urgency.  Occasional incontinence if he waits too long to urinate.  No straining.  Does note some starting and stopping.  He has followed with urology previously.  Ditropan was somewhat beneficial.  He had a CT scan abdomen and pelvis with no obvious cause for his symptoms.  Incidentally noted slight nodular thickening of the left adrenal gland.  No prior records to compare this to.  Needs follow-up labs regarding this.  Was noted to have atherosclerosis within his abdominal aorta.  He takes aspirin every other day.  He is taking Lipitor.  No claudication.  No chest pain or shortness of breath.  Is staying active.  He reports he had a sleep study.  We have not received the results of this yet.  Social History   Tobacco Use  Smoking Status Never Smoker  Smokeless Tobacco Never Used     ROS see history of present illness  Objective  Physical Exam Vitals:   02/15/17 0835  BP: (!) 148/70  Pulse: (!) 59  Temp: 98.4 F (36.9 C)  SpO2: 98%    BP Readings from Last 3 Encounters:  02/15/17 (!) 148/70  01/28/17 122/66  01/18/17 (!) 142/80   Wt Readings from Last 3 Encounters:  02/15/17 162 lb 3.2 oz (73.6 kg)  01/28/17 158 lb (71.7 kg)  01/18/17 160 lb (72.6 kg)    Physical Exam  Constitutional: No distress.  Cardiovascular: Normal rate, regular rhythm and normal heart sounds.  Pulmonary/Chest: Effort normal and breath sounds normal.  Abdominal: Soft. Bowel sounds are normal. He exhibits no  distension. There is no tenderness. There is no rebound and no guarding.  Musculoskeletal: He exhibits no edema.  Neurological: He is alert. Gait normal.  Skin: Skin is warm and dry. He is not diaphoretic.     Assessment/Plan: Please see individual problem list.  Benign prostatic hyperplasia with nocturia Symptoms consistent with BPH.  We will go ahead and refer to urology to determine a more appropriate treatment regimen.  He will continue Ditropan for now.  Discussed potential side effects of this medication particularly dry mouth.  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  Discussed need to ensure that this is not overly active.  Lab orders placed.  Discussed dexamethasone suppression test and taking dexamethasone at 11 PM the night before his lab test.  Abdominal aortic atherosclerosis ([place]) Incidentally noted on CT scan.  Discussed risks and benefits of daily aspirin.  Encourage patient to start on this given the likely benefits and lack of bleeding issues with his current dosing.  He will continue Lipitor.  Monitor for symptoms.   Orders Placed This Encounter  Procedures  . Metanephrines, Urine, 24 hour    Standing Status:   Future    Standing Expiration Date:   02/15/2018  . Catecholamines, fractionated, Urine, 24 hour    Standing Status:   Future    Standing Expiration Date:   02/15/2018  . Cortisol    Standing Status:   Future    Standing  Expiration Date:   02/15/2018  . Ambulatory referral to Urology    Referral Priority:   Routine    Referral Type:   Consultation    Referral Reason:   Specialty Services Required    Requested Specialty:   Urology    Number of Visits Requested:   1    Meds ordered this encounter  Medications  . dexamethasone (DECADRON) 1 MG tablet    Sig: Take 1 tablet (1 mg total) by mouth once for 1 dose. At 11 pm the night before lab work.    Dispense:  1 tablet    Refill:  0     [name], MD [place]

## 2017-02-22 ENCOUNTER — Telehealth: Payer: Self-pay | Admitting: Family Medicine

## 2017-02-22 MED ORDER — DEXAMETHASONE 1 MG PO TABS: 1.0000 mg | ORAL_TABLET | Freq: Once | ORAL | 0 refills | Status: AC

## 2017-02-22 NOTE — Telephone Encounter (Signed)
Please advise 

## 2017-02-22 NOTE — Telephone Encounter (Signed)
Patient notified and scheduled 

## 2017-02-22 NOTE — Telephone Encounter (Signed)
Dexamethasone sent to pharmacy.  This is to be taken at 11 PM the night before he comes in for his cortisol test.  It does not look like he has been set up for labs.  Please contact him to get him scheduled for this.  Thanks.

## 2017-02-22 NOTE — Telephone Encounter (Signed)
Pt calling stating he lost a prescription and does not remember the name of the prescription, but wants to know if the medication could be called in to [facility].Pt seen by Dr. Caryl Bis on 2/4 and it appears a prescription was written for Dexamethasone 1mg , for one tablet to be dispensed.

## 2017-02-22 NOTE — Telephone Encounter (Signed)
Copied from [place]. Topic: Quick Communication - See Telephone Encounter >> Feb 22, 2017  9:18 AM Burnis Medin, NT wrote: CRM for notification. See Telephone encounter for: Patient called and said he was just seen in the  office and said the doctor gave him a prescription. Pt said he lost prescription and does not remember the name of the medication. Pt wanted to know if the doctor could call him in medication. Pt use [facility] [place] [place] [phone] (Phone) [phone] (Fax)    02/22/17.

## 2017-02-24 ENCOUNTER — Other Ambulatory Visit (INDEPENDENT_AMBULATORY_CARE_PROVIDER_SITE_OTHER): Payer: Medicare Other

## 2017-02-24 DIAGNOSIS — Q891 Congenital malformations of adrenal gland: Secondary | ICD-10-CM | POA: Diagnosis not present

## 2017-02-24 LAB — CORTISOL: CORTISOL PLASMA: 0.6 ug/dL

## 2017-03-01 ENCOUNTER — Telehealth: Payer: Self-pay | Admitting: Urology

## 2017-03-01 ENCOUNTER — Ambulatory Visit: Payer: Medicare Other

## 2017-03-01 ENCOUNTER — Ambulatory Visit (INDEPENDENT_AMBULATORY_CARE_PROVIDER_SITE_OTHER): Payer: Medicare Other | Admitting: Urology

## 2017-03-01 ENCOUNTER — Encounter: Payer: Self-pay | Admitting: Urology

## 2017-03-01 VITALS — BP 149/81 | HR 60 | Ht 68.5 in | Wt 162.4 lb

## 2017-03-01 DIAGNOSIS — N400 Enlarged prostate without lower urinary tract symptoms: Secondary | ICD-10-CM | POA: Diagnosis not present

## 2017-03-01 DIAGNOSIS — I6523 Occlusion and stenosis of bilateral carotid arteries: Secondary | ICD-10-CM | POA: Diagnosis not present

## 2017-03-01 DIAGNOSIS — R350 Frequency of micturition: Secondary | ICD-10-CM

## 2017-03-01 LAB — URINALYSIS, COMPLETE
BILIRUBIN UA: NEGATIVE
Glucose, UA: NEGATIVE
Ketones, UA: NEGATIVE
Leukocytes, UA: NEGATIVE
NITRITE UA: NEGATIVE
PH UA: 5.5 (ref 5.0–7.5)
Protein, UA: NEGATIVE
UUROB: 0.2 mg/dL (ref 0.2–1.0)

## 2017-03-01 NOTE — Telephone Encounter (Signed)
FYI - pt did not wish to make follow up appt, states he will see Dr. Caryl Bis

## 2017-03-01 NOTE — Progress Notes (Signed)
03/01/2017 3:21 PM   Shane Acevedo August 14, 1933 [id number]  Referring provider: Leone Haven, MD [address] [place], [place] [address]  Chief Complaint  Patient presents with  . Benign Prostatic Hypertrophy    HPI: I was consulted to assess the patient's frequency and urgency.  The more I spoke with him it appears that he was having a little bit urge incontinence but in the last 2 weeks he is doing quite well on oxybutynin.  His flow varies but is reasonable.  He now voids every 3 hours and gets up once at night  He denies urinary tract infections and has not had previous GU surgery or kidney stones.  He has no neurologic issues.  Another medication tried caused dizziness but he did not think it was Myrbetriq  Modifying factors: There are no other modifying factors  Associated signs and symptoms: There are no other associated signs and symptoms Aggravating and relieving factors: There are no other aggravating or relieving factors Severity: Moderate Duration: Persistent   PMH: Past Medical History:  Diagnosis Date  . Allergy   . Anxiety   . Depression   . Heart disease   . Hypertension     Surgical History: Past Surgical History:  Procedure Laterality Date  . CORONARY STENT PLACEMENT Right   . TONSILLECTOMY    . TOTAL HIP ARTHROPLASTY Right     Home Medications:  Allergies as of 03/01/2017   No Known Allergies     Medication List        Accurate as of 03/01/17  3:21 PM. Always use your most recent med list.          amLODipine 10 MG tablet Commonly known as:  NORVASC Take 1 tablet (10 mg total) by mouth daily.   aspirin EC 81 MG tablet Take 81 mg by mouth daily.   atorvastatin 10 MG tablet Commonly known as:  LIPITOR Take 1 tablet (10 mg total) by mouth daily.   CENTRUM SILVER PO Take by mouth.   dexamethasone 1 MG tablet Commonly known as:  DECADRON   lamoTRIgine 200 MG tablet Commonly known as:  LAMICTAL Take 1  tablet (200 mg total) by mouth daily with supper.   mirtazapine 7.5 MG tablet Commonly known as:  REMERON Take 1 tablet (7.5 mg total) by mouth at bedtime.   omeprazole 20 MG capsule Commonly known as:  PRILOSEC Take 1 capsule (20 mg total) by mouth daily.   oxybutynin 5 MG 24 hr tablet Commonly known as:  DITROPAN-XL Take 1 tablet (5 mg total) by mouth at bedtime.       Allergies: No Known Allergies  Family History: Family History  Problem Relation Age of Onset  . Alcoholism Other   . Arthritis Other   . Lung cancer Other   . Mental illness Other   . Emphysema Mother   . Bipolar disorder Father   . Coronary artery disease Sister   . Schizophrenia Sister     Social History:  reports that  has never smoked. he has never used smokeless tobacco. He reports that he drinks about 2.4 oz of alcohol per week. He reports that he does not use drugs.  ROS: UROLOGY Frequent Urination?: Yes Hard to postpone urination?: Yes Burning/pain with urination?: No Get up at night to urinate?: Yes Leakage of urine?: No Urine stream starts and stops?: Yes Trouble starting stream?: No Do you have to strain to urinate?: No Blood in urine?: No Urinary tract  infection?: No Sexually transmitted disease?: No Injury to kidneys or bladder?: No Painful intercourse?: No Weak stream?: No Erection problems?: No Penile pain?: No  Gastrointestinal Nausea?: No Vomiting?: No Indigestion/heartburn?: No Diarrhea?: No Constipation?: No  Constitutional Fever: No Night sweats?: No Weight loss?: No Fatigue?: No  Skin Skin rash/lesions?: No Itching?: No  Eyes Blurred vision?: No Double vision?: No  Ears/Nose/Throat Sore throat?: No  Hematologic/Lymphatic Swollen glands?: No Easy bruising?: Yes  Cardiovascular Leg swelling?: No Chest pain?: No  Respiratory Cough?: No Shortness of breath?: No  Endocrine Excessive thirst?: No  Musculoskeletal Back pain?: Yes Joint pain?:  Yes  Neurological Headaches?: No Dizziness?: No  Psychologic Depression?: Yes Anxiety?: Yes  Physical Exam: BP (!) 149/81 (BP Location: Right Arm, Patient Position: Sitting, Cuff Size: Normal)   Pulse 60   Ht 5' 8.5" (1.74 m)   Wt 162 lb 6.4 oz (73.7 kg)   SpO2 99%   BMI 24.33 kg/m   Constitutional:  Alert and oriented, No acute distress. HEENT: [place] AT, moist mucus membranes.  Trachea midline, no masses. Cardiovascular: No clubbing, cyanosis, or edema. Respiratory: Normal respiratory effort, no increased work of breathing. GI: Abdomen is soft, nontender, nondistended, no abdominal masses GU: No CVA tenderness.  50 g benign prostate Skin: No rashes, bruises or suspicious lesions. Lymph: No cervical or inguinal adenopathy. Neurologic: Grossly intact, no focal deficits, moving all 4 extremities. Psychiatric: Normal mood and affect.  Laboratory Data: Lab Results  Component Value Date   WBC 7.0 01/28/2017   HGB 14.4 01/28/2017   HCT 43.4 01/28/2017   MCV 92.9 01/28/2017   PLT 239.0 01/28/2017    Lab Results  Component Value Date   CREATININE 1.11 01/18/2017    Lab Results  Component Value Date   PSA 0.42 01/18/2017    No results found for: TESTOSTERONE  No results found for: HGBA1C  Urinalysis    Component Value Date/Time   COLORURINE YELLOW 01/28/2017 [place] 01/28/2017 1011   LABSPEC 1.020 01/28/2017 1011   PHURINE 6.0 01/28/2017 1011   GLUCOSEU NEGATIVE 01/28/2017 [place] 01/28/2017 [place] 01/28/2017 1011   BILIRUBINUR neg 01/18/2017 1419   [place] 01/28/2017 1011   PROTEINUR neg 01/18/2017 1419   UROBILINOGEN 0.2 01/28/2017 1011   NITRITE NEGATIVE 01/28/2017 1011   LEUKOCYTESUR NEGATIVE 01/28/2017 1011    Pertinent Imaging: None  Assessment & Plan: The patient has an urgent bladder with mild urge incontinence and some mild weakening of his stream.  Perhaps he had tried an alpha  blocker that caused dizziness.  He is doing well on oxybutynin extended release 5 mg.  For convenience he will follow-up with his primary care physician.  He may have good days and bad days.  Anxiety can make it worse.  He can also be increased to 10 mg if needed.  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  I will see him as needed  1. Benign prostatic hyperplasia, unspecified whether lower urinary tract symptoms present 2.  Urgency incontinence - Urinalysis, Complete - Bladder Scan (Post Void Residual) in office   No Follow-up on file.  Reece Packer, MD  [facility] [address], [place], [place] [address] [phone]

## 2017-03-08 ENCOUNTER — Ambulatory Visit: Payer: Medicare Other

## 2017-03-22 ENCOUNTER — Telehealth: Payer: Self-pay

## 2017-03-22 DIAGNOSIS — F411 Generalized anxiety disorder: Secondary | ICD-10-CM

## 2017-03-22 MED ORDER — MIRTAZAPINE 7.5 MG PO TABS: 7.5000 mg | ORAL_TABLET | Freq: Every day | ORAL | 1 refills | Status: DC

## 2017-03-22 NOTE — Telephone Encounter (Signed)
Pt was called and told rx was sent into pharmacy

## 2017-03-22 NOTE — Telephone Encounter (Signed)
Sent Remeron to pharmacy 

## 2017-03-22 NOTE — Telephone Encounter (Signed)
pt called left message that he needs a refill on his mirtazapine  pt has appt for 04-05-17  Pt needs enough to get to apt  mirtazapine (REMERON) 7.5 MG tablet  Medication  Date: 01/20/2017 Department: [facility] Ordering/Authorizing: Ursula Alert, MD  Order Providers   Prescribing Provider Encounter Provider  Ursula Alert, MD Ursula Alert, MD  Medication Detail    Disp Refills Start End   mirtazapine (REMERON) 7.5 MG tablet 30 tablet 1 01/20/2017    Sig - Route: Take 1 tablet (7.5 mg total) by mouth at bedtime. - Oral   Sent to pharmacy as: mirtazapine (REMERON) 7.5 MG tablet   E-Prescribing Status: Receipt confirmed by pharmacy (01/20/2017 4:55 PM EST)

## 2017-04-07 ENCOUNTER — Other Ambulatory Visit: Payer: Self-pay | Admitting: Family Medicine

## 2017-04-07 DIAGNOSIS — R350 Frequency of micturition: Secondary | ICD-10-CM

## 2017-04-07 DIAGNOSIS — N3281 Overactive bladder: Secondary | ICD-10-CM

## 2017-04-07 DIAGNOSIS — N401 Enlarged prostate with lower urinary tract symptoms: Secondary | ICD-10-CM

## 2017-04-07 NOTE — Telephone Encounter (Signed)
Copied from [place] [id number]. Topic: Quick Communication - Rx Refill/Question >> Apr 07, 2017  8:49 AM Ahmed Prima L wrote: Medication: oxybutynin (DITROPAN-XL) 5 MG 24 hr tablet Has the patient contacted their pharmacy? Yes no refills (Agent: If no, request that the patient contact the pharmacy for the refill.) Preferred Pharmacy (with phone number or street name): [id number] - [place], [place] Agent: Please be advised that RX refills may take up to 3 business days. We ask that you follow-up with your pharmacy.

## 2017-04-07 NOTE — Telephone Encounter (Signed)
Please advise 

## 2017-04-07 NOTE — Telephone Encounter (Signed)
LOV  02/15/17 NOV  05/17/17 Provider: Dr. Caryl Bis Pharmacy; [facility] [phone] [address] [place], [place]

## 2017-04-08 ENCOUNTER — Ambulatory Visit (INDEPENDENT_AMBULATORY_CARE_PROVIDER_SITE_OTHER): Payer: Medicare Other | Admitting: Psychiatry

## 2017-04-08 ENCOUNTER — Other Ambulatory Visit: Payer: Self-pay

## 2017-04-08 ENCOUNTER — Encounter: Payer: Self-pay | Admitting: Psychiatry

## 2017-04-08 VITALS — BP 147/74 | HR 63 | Temp 97.3°F | Wt 164.4 lb

## 2017-04-08 DIAGNOSIS — F411 Generalized anxiety disorder: Secondary | ICD-10-CM | POA: Diagnosis not present

## 2017-04-08 DIAGNOSIS — I6523 Occlusion and stenosis of bilateral carotid arteries: Secondary | ICD-10-CM

## 2017-04-08 MED ORDER — OXYBUTYNIN CHLORIDE ER 5 MG PO TB24: 5.0000 mg | ORAL_TABLET | Freq: Every day | ORAL | 1 refills | Status: DC

## 2017-04-08 MED ORDER — MIRTAZAPINE 7.5 MG PO TABS: 7.5000 mg | ORAL_TABLET | Freq: Every day | ORAL | 1 refills | Status: DC

## 2017-04-08 MED ORDER — LAMOTRIGINE 200 MG PO TABS: 100.0000 mg | ORAL_TABLET | Freq: Every day | ORAL | 1 refills | Status: DC

## 2017-04-08 NOTE — Patient Instructions (Signed)
CONTINUE LAMICTAL 100 MG DAILY ( HALF OF 200 MG DAILY )  CONTINUE REMERON 7.5 MG AT BEDTIME.

## 2017-04-08 NOTE — Progress Notes (Signed)
[facility] MD OP Progress Note  04/08/2017 10:02 AM Savannah Erbe  MRN:  [id number]  Chief Complaint: ' I  Am doing well.." Chief Complaint    Follow-up; Medication Refill     HPI: Carless is an 84 yr old male, married, lives in [place], has a history of anxiety as well as mood symptoms, insomnia, presented to the clinic today for a follow-up visit.   Shane Acevedo today reports that he has been doing well.  He reports he is currently compliant with his medications.  He was able to taper the Lamictal down to 100 mg daily.  He continues to take the Remeron 7.5 mg at bedtime.  He reports he has not noticed any significant changes in his mood symptoms after he was able to cut down the Lamictal.  He continues to tolerate the medications well.  He reports sleep is good.  He reports he continues to stay active doing a lot of activities at the senior living community.  He reports he had a Mardi grass parade in the community and he got all dressed up with the rest of the residents there.  He reports he is in charge of a lot of activities and has been busy taking care of that.  He also reports he was busy doing the income tax returns for himself and also his wife's trust.  He reports he is almost done with that and that is a relief for him.  He is looking forward to the warm weather and reports that he would like to take a vacation soon.  He looks forward to going to [place] to participate in his grandchild's graduation ceremony.  He denies any other concerns today.  Visit Diagnosis:    ICD-10-CM   1. GAD (generalized anxiety disorder) F41.1 mirtazapine (REMERON) 7.5 MG tablet    lamoTRIgine (LAMICTAL) 200 MG tablet    Past Psychiatric History: Hx of anxiety and insomnia.  He carried a diagnosis of mood disorder in the past.  He was being treated by Dr. Barbarann Ehlers in [place].  He denies any history of suicide attempts.  He denies any past mental health hospital admission.  He tried Lexapro in the  past but his daughter-did not work in the past.  Past Medical History:  Past Medical History:  Diagnosis Date  . Allergy   . Anxiety   . Depression   . Heart disease   . Hypertension     Past Surgical History:  Procedure Laterality Date  . CORONARY STENT PLACEMENT Right   . TONSILLECTOMY    . TOTAL HIP ARTHROPLASTY Right     Family Psychiatric History: Father had history of bipolar disorder, sister has paranoid schizophrenia, mother with Alzheimer's dementia, wife has depression.  Family History:  Family History  Problem Relation Age of Onset  . Alcoholism Other   . Arthritis Other   . Lung cancer Other   . Mental illness Other   . Emphysema Mother   . Bipolar disorder Father   . Coronary artery disease Sister   . Schizophrenia Sister    Substance abuse history: Denies  Social History: Married . Has 2 daughters.  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  He also did a lot of other work in the past.  He is currently retired.  He moved from [place] to [place] in May 2018 to be closer to his daughter who currently lives in [place].  He currently lives in a senior living  community in [place]. Social History   Socioeconomic History  . Marital status: Married    Spouse name: Not on file  . Number of children: Not on file  . Years of education: Not on file  . Highest education level: Not on file  Occupational History  . Not on file  Social Needs  . Financial resource strain: Not on file  . Food insecurity:    Worry: Not on file    Inability: Not on file  . Transportation needs:    Medical: Not on file    Non-medical: Not on file  Tobacco Use  . Smoking status: Never Smoker  . Smokeless tobacco: Never Used  Substance and Sexual Activity  . Alcohol use: Yes    Alcohol/week: 2.4 oz    Types: 2 Glasses of wine, 2 Cans of beer per week  . Drug use: No  . Sexual activity: Not Currently  Lifestyle  . Physical activity:     Days per week: Not on file    Minutes per session: Not on file  . Stress: Not on file  Relationships  . Social connections:    Talks on phone: Not on file    Gets together: Not on file    Attends religious service: Not on file    Active member of club or organization: Not on file    Attends meetings of clubs or organizations: Not on file    Relationship status: Not on file  Other Topics Concern  . Not on file  Social History Narrative  . Not on file    Allergies: No Known Allergies  Metabolic Disorder Labs: No results found for: HGBA1C, MPG No results found for: PROLACTIN No results found for: CHOL, TRIG, HDL, CHOLHDL, VLDL, LDLCALC No results found for: TSH  Therapeutic Level Labs: No results found for: LITHIUM No results found for: VALPROATE No components found for:  CBMZ  Current Medications: Current Outpatient Medications  Medication Sig Dispense Refill  . amLODipine (NORVASC) 10 MG tablet Take 1 tablet (10 mg total) by mouth daily. 90 tablet 3  . aspirin EC 81 MG tablet Take 81 mg by mouth daily.    Marland Kitchen atorvastatin (LIPITOR) 10 MG tablet Take 1 tablet (10 mg total) by mouth daily. 90 tablet 3  . dexamethasone (DECADRON) 1 MG tablet     . lamoTRIgine (LAMICTAL) 200 MG tablet Take 0.5 tablets (100 mg total) by mouth daily with supper. 45 tablet 1  . mirtazapine (REMERON) 7.5 MG tablet Take 1 tablet (7.5 mg total) by mouth at bedtime. 90 tablet 1  . Multiple Vitamins-Minerals (CENTRUM SILVER PO) Take by mouth.    Marland Kitchen omeprazole (PRILOSEC) 20 MG capsule Take 1 capsule (20 mg total) by mouth daily. 90 capsule 1  . oxybutynin (DITROPAN-XL) 5 MG 24 hr tablet Take 1 tablet (5 mg total) by mouth at bedtime. 30 tablet 1   No current facility-administered medications for this visit.      Musculoskeletal: Strength & Muscle Tone: within normal limits Gait & Station: normal Patient leans: N/A  Psychiatric Specialty Exam: Review of Systems  Psychiatric/Behavioral: The  patient is nervous/anxious (improved).   All other systems reviewed and are negative.   Blood pressure (!) 147/74, pulse 63, temperature (!) 97.3 F (36.3 C), temperature source Oral, weight 164 lb 6.4 oz (74.6 kg).Body mass index is 24.63 kg/m.  General Appearance: Casual  Eye Contact:  Fair  Speech:  Normal Rate  Volume:  Normal  Mood:  Euthymic  Affect:  Congruent  Thought Process:  Goal Directed and Descriptions of Associations: Intact  Orientation:  Full (Time, Place, and Person)  Thought Content: Logical   Suicidal Thoughts:  No  Homicidal Thoughts:  No  Memory:  Immediate;   Fair Recent;   Fair Remote;   Fair  Judgement:  Fair  Insight:  Fair  Psychomotor Activity:  Normal  Concentration:  Concentration: Fair and Attention Span: Fair  Recall:  [employer] of Knowledge: Fair  Language: Fair  Akathisia:  No  Handed:  Right  AIMS (if indicated): na  Assets:  Communication Skills Desire for Improvement Housing Intimacy Social Support Talents/Skills Transportation  ADL's:  Intact  Cognition: WNL  Sleep:  Fair   Screenings: PHQ2-9     Office Visit from 07/03/2016 in [facility]  PHQ-2 Total Score  6  PHQ-9 Total Score  12       Assessment and Plan: Hy is an 89 y old Caucasian male who has a history of anxiety, mood disorder, insomnia, OSA as well as medical problems like coronary artery disease, hypertension, presented to the clinic today for a follow-up visit.  Patient continues to be compliant with medications.  He is very active at the senior living community.  He continues to be supportive of his wife who also deals with mental health issues.  He does have daughters who live close to him.  He was able to wean down the Lamictal to 100 mg and is doing well.  Denies any significant mood symptoms.  Discussed to continue the medications as prescribed.  Plan as noted below.  Plan For anxiety Continue Remeron 7.5 mg p.o. nightly Continue  Lamictal 100 mg p.o. daily.  He reports he has been cutting the 200 mg tablet into half.  For insomnia Continue Remeron 7.5 mg p.o. nightly  Discussed elevated blood pressure, he reports he does have a history of having high blood pressure when he is at a doctor's office.  Provided supportive psychotherapy for 10 minutes.  Follow-up in clinic in 4 months or sooner if needed.  More than 50 % of the time was spent for psychoeducation and supportive psychotherapy and care coordination.  This note was generated in part or whole with voice recognition software. Voice recognition is usually quite accurate but there are transcription errors that can and very often do occur. I apologize for any typographical errors that were not detected and corrected.       Ursula Alert, MD 04/08/2017, 10:02 AM

## 2017-05-17 ENCOUNTER — Ambulatory Visit: Payer: Medicare Other | Admitting: Family Medicine

## 2017-05-17 DIAGNOSIS — D2261 Melanocytic nevi of right upper limb, including shoulder: Secondary | ICD-10-CM | POA: Diagnosis not present

## 2017-05-17 DIAGNOSIS — X32XXXA Exposure to sunlight, initial encounter: Secondary | ICD-10-CM | POA: Diagnosis not present

## 2017-05-17 DIAGNOSIS — D225 Melanocytic nevi of trunk: Secondary | ICD-10-CM | POA: Diagnosis not present

## 2017-05-17 DIAGNOSIS — D2272 Melanocytic nevi of left lower limb, including hip: Secondary | ICD-10-CM | POA: Diagnosis not present

## 2017-05-17 DIAGNOSIS — L218 Other seborrheic dermatitis: Secondary | ICD-10-CM | POA: Diagnosis not present

## 2017-05-17 DIAGNOSIS — D2271 Melanocytic nevi of right lower limb, including hip: Secondary | ICD-10-CM | POA: Diagnosis not present

## 2017-05-17 DIAGNOSIS — D2262 Melanocytic nevi of left upper limb, including shoulder: Secondary | ICD-10-CM | POA: Diagnosis not present

## 2017-05-17 DIAGNOSIS — L570 Actinic keratosis: Secondary | ICD-10-CM | POA: Diagnosis not present

## 2017-06-14 ENCOUNTER — Other Ambulatory Visit: Payer: Self-pay | Admitting: Family Medicine

## 2017-06-14 DIAGNOSIS — N3281 Overactive bladder: Secondary | ICD-10-CM

## 2017-06-14 DIAGNOSIS — R350 Frequency of micturition: Secondary | ICD-10-CM

## 2017-06-14 DIAGNOSIS — N401 Enlarged prostate with lower urinary tract symptoms: Secondary | ICD-10-CM

## 2017-07-12 ENCOUNTER — Telehealth: Payer: Self-pay | Admitting: Family Medicine

## 2017-07-12 ENCOUNTER — Other Ambulatory Visit: Payer: Self-pay | Admitting: Family Medicine

## 2017-07-12 DIAGNOSIS — N401 Enlarged prostate with lower urinary tract symptoms: Secondary | ICD-10-CM

## 2017-07-12 DIAGNOSIS — R350 Frequency of micturition: Secondary | ICD-10-CM

## 2017-07-12 DIAGNOSIS — N3281 Overactive bladder: Secondary | ICD-10-CM

## 2017-07-12 NOTE — Telephone Encounter (Signed)
Rx refilled by office 07/12/17

## 2017-07-12 NOTE — Telephone Encounter (Signed)
Copied from [place] [id number]. Topic: Quick Communication - Rx Refill/Question >> Jul 12, 2017  7:45 AM Celedonio Savage L wrote: Medication: oxybutynin (DITROPAN-XL) 5 MG 24 hr tablet  Has the patient contacted their pharmacy? No. We call when he get off phone with me (Agent: If no, request that the patient contact the pharmacy for the refill.) (Agent: If yes, when and what did the pharmacy advise?)  Preferred Pharmacy (with phone number or street name): [id number] - [place], [place] [phone] (Phone) [phone] (Fax)      Agent: Please be advised that RX refills may take up to 3 business days. We ask that you follow-up with your pharmacy.

## 2017-07-14 ENCOUNTER — Encounter: Payer: Self-pay | Admitting: Psychiatry

## 2017-07-14 ENCOUNTER — Ambulatory Visit (INDEPENDENT_AMBULATORY_CARE_PROVIDER_SITE_OTHER): Payer: Medicare Other | Admitting: Psychiatry

## 2017-07-14 ENCOUNTER — Other Ambulatory Visit: Payer: Self-pay

## 2017-07-14 VITALS — BP 161/73 | HR 56 | Temp 97.8°F | Wt 170.0 lb

## 2017-07-14 DIAGNOSIS — I10 Essential (primary) hypertension: Secondary | ICD-10-CM

## 2017-07-14 DIAGNOSIS — F411 Generalized anxiety disorder: Secondary | ICD-10-CM

## 2017-07-14 DIAGNOSIS — I6523 Occlusion and stenosis of bilateral carotid arteries: Secondary | ICD-10-CM

## 2017-07-14 MED ORDER — MIRTAZAPINE 7.5 MG PO TABS: 7.5000 mg | ORAL_TABLET | Freq: Every day | ORAL | 1 refills | Status: DC

## 2017-07-14 MED ORDER — LAMOTRIGINE 200 MG PO TABS: 100.0000 mg | ORAL_TABLET | Freq: Every day | ORAL | 1 refills | Status: DC

## 2017-07-14 NOTE — Progress Notes (Signed)
[facility] MD OP Progress Note  07/14/2017 5:13 PM Shane Acevedo  MRN:  [id number]  Chief Complaint: ' I am here for follow up." Chief Complaint    Follow-up; Medication Refill; Anxiety     HPI: Shane Acevedo is an 84 year old male, married, lives in [place], has a history of anxiety as well as mood symptoms, insomnia, presented to the clinic today for a follow-up visit.  Patient today reports he is currently doing well on the current medication regimen.  He continues to take the Lamictal 100 mg and mirtazapine 7.5 mg.  Patient reports overall he is currently doing well with regards to his mood symptoms.  He continues to  take care of his wife who needs help and due to multiple medical problems.  Patient reports he remains active at the senior living community and that helps him.  Discussed his elevated blood pressure today.  Patient reports he will keep a log of it and reach out to his PMD.  He is currently on antihypertensive medications. Visit Diagnosis:    ICD-10-CM   1. GAD (generalized anxiety disorder) F41.1 lamoTRIgine (LAMICTAL) 200 MG tablet    mirtazapine (REMERON) 7.5 MG tablet  2. Elevated blood pressure reading in office with diagnosis of hypertension I10     Past Psychiatric History: Reviewed past psychiatric history from my progress note on 04/08/2017.  Past Medical History:  Past Medical History:  Diagnosis Date  . Allergy   . Anxiety   . Depression   . Heart disease   . Hypertension     Past Surgical History:  Procedure Laterality Date  . CORONARY STENT PLACEMENT Right   . TONSILLECTOMY    . TOTAL HIP ARTHROPLASTY Right     Family Psychiatric History: Reviewed family psychiatric history from my progress note on 04/08/2017.  Family History:  Family History  Problem Relation Age of Onset  . Alcoholism Other   . Arthritis Other   . Lung cancer Other   . Mental illness Other   . Emphysema Mother   . Bipolar disorder Father   . Coronary artery disease Sister    . Schizophrenia Sister     Substance abuse history: Denies   Social History: Reviewed social history from my progress note on 04/08/2017 Social History   Socioeconomic History  . Marital status: Married    Spouse name: Not on file  . Number of children: Not on file  . Years of education: Not on file  . Highest education level: Not on file  Occupational History  . Not on file  Social Needs  . Financial resource strain: Not on file  . Food insecurity:    Worry: Not on file    Inability: Not on file  . Transportation needs:    Medical: Not on file    Non-medical: Not on file  Tobacco Use  . Smoking status: Never Smoker  . Smokeless tobacco: Never Used  Substance and Sexual Activity  . Alcohol use: Yes    Alcohol/week: 2.4 oz    Types: 2 Glasses of wine, 2 Cans of beer per week  . Drug use: No  . Sexual activity: Not Currently  Lifestyle  . Physical activity:    Days per week: Not on file    Minutes per session: Not on file  . Stress: Not on file  Relationships  . Social connections:    Talks on phone: Not on file    Gets together: Not on file    Attends religious service:  Not on file    Active member of club or organization: Not on file    Attends meetings of clubs or organizations: Not on file    Relationship status: Not on file  Other Topics Concern  . Not on file  Social History Narrative  . Not on file    Allergies: No Known Allergies  Metabolic Disorder Labs: No results found for: HGBA1C, MPG No results found for: PROLACTIN No results found for: CHOL, TRIG, HDL, CHOLHDL, VLDL, LDLCALC No results found for: TSH  Therapeutic Level Labs: No results found for: LITHIUM No results found for: VALPROATE No components found for:  CBMZ  Current Medications: Current Outpatient Medications  Medication Sig Dispense Refill  . amLODipine (NORVASC) 10 MG tablet Take 1 tablet (10 mg total) by mouth daily. 90 tablet 3  . aspirin EC 81 MG tablet Take 81 mg by  mouth daily.    Marland Kitchen atorvastatin (LIPITOR) 10 MG tablet Take 1 tablet (10 mg total) by mouth daily. 90 tablet 3  . dexamethasone (DECADRON) 1 MG tablet     . lamoTRIgine (LAMICTAL) 200 MG tablet Take 0.5 tablets (100 mg total) by mouth daily with supper. 45 tablet 1  . mirtazapine (REMERON) 7.5 MG tablet Take 1 tablet (7.5 mg total) by mouth at bedtime. 90 tablet 1  . Multiple Vitamins-Minerals (CENTRUM SILVER PO) Take by mouth.    Marland Kitchen omeprazole (PRILOSEC) 20 MG capsule Take 1 capsule (20 mg total) by mouth daily. 90 capsule 1  . oxybutynin (DITROPAN-XL) 5 MG 24 hr tablet TAKE 1 TABLET BY MOUTH AT BEDTIME 30 tablet 0   No current facility-administered medications for this visit.      Musculoskeletal: Strength & Muscle Tone: within normal limits Gait & Station: normal Patient leans: N/A  Psychiatric Specialty Exam: Review of Systems  Psychiatric/Behavioral: Positive for depression (improving). The patient is nervous/anxious (situational).   All other systems reviewed and are negative.   Blood pressure (!) 161/73, pulse (!) 56, temperature 97.8 F (36.6 C), temperature source Oral, weight 170 lb (77.1 kg).Body mass index is 25.47 kg/m.  General Appearance: Casual  Eye Contact:  Fair  Speech:  Clear and Coherent  Volume:  Normal  Mood:  Anxious  Affect:  Congruent  Thought Process:  Goal Directed and Descriptions of Associations: Intact  Orientation:  Full (Time, Place, and Person)  Thought Content: Logical   Suicidal Thoughts:  No  Homicidal Thoughts:  No  Memory:  Immediate;   Fair Recent;   Fair Remote;   Fair  Judgement:  Fair  Insight:  Fair  Psychomotor Activity:  Normal  Concentration:  Concentration: Fair and Attention Span: Fair  Recall:  [employer] of Knowledge: Fair  Language: Fair  Akathisia:  No  Handed:  Right  AIMS (if indicated): na  Assets:  Communication Skills Desire for Improvement Social Support  ADL's:  Intact  Cognition: WNL  Sleep:  IMPROVING    Screenings: PHQ2-9     Office Visit from 07/03/2016 in [facility]  PHQ-2 Total Score  6  PHQ-9 Total Score  12       Assessment and Plan: Shane Acevedo is an 84 year old Caucasian male who has a history of anxiety, mood disorder, insomnia, OSA as well as medical problems like coronary artery disease, hypertension, presented to the clinic today for a follow-up visit.  Patient continues to be compliant with his medications and reports mood symptoms are stable.  Will continue plan as noted below.  Plan For anxiety Continue Remeron 7.5 mg p.o. nightly Continue Lamictal 100 mg p.o. Daily.  Insomnia Continue Remeron 7.5 mg p.o. nightly  Elevated blood pressure Patient to keep log of his blood pressure and talk to his primary medical doctor.  Follow-up in clinic in 3 months.  More than 50 % of the time was spent for psychoeducation and supportive psychotherapy and care coordination.  This note was generated in part or whole with voice recognition software. Voice recognition is usually quite accurate but there are transcription errors that can and very often do occur. I apologize for any typographical errors that were not detected and corrected.         Ursula Alert, MD 07/14/2017, 5:13 PM

## 2017-07-19 ENCOUNTER — Ambulatory Visit (INDEPENDENT_AMBULATORY_CARE_PROVIDER_SITE_OTHER): Payer: Medicare Other | Admitting: Family Medicine

## 2017-07-19 ENCOUNTER — Encounter: Payer: Self-pay | Admitting: Family Medicine

## 2017-07-19 VITALS — BP 132/80 | HR 53 | Temp 98.2°F | Wt 168.4 lb

## 2017-07-19 DIAGNOSIS — F329 Major depressive disorder, single episode, unspecified: Secondary | ICD-10-CM | POA: Diagnosis not present

## 2017-07-19 DIAGNOSIS — F32A Depression, unspecified: Secondary | ICD-10-CM

## 2017-07-19 DIAGNOSIS — I6523 Occlusion and stenosis of bilateral carotid arteries: Secondary | ICD-10-CM | POA: Diagnosis not present

## 2017-07-19 DIAGNOSIS — Q891 Congenital malformations of adrenal gland: Secondary | ICD-10-CM

## 2017-07-19 DIAGNOSIS — E782 Mixed hyperlipidemia: Secondary | ICD-10-CM | POA: Diagnosis not present

## 2017-07-19 DIAGNOSIS — I10 Essential (primary) hypertension: Secondary | ICD-10-CM | POA: Diagnosis not present

## 2017-07-19 DIAGNOSIS — F419 Anxiety disorder, unspecified: Secondary | ICD-10-CM | POA: Diagnosis not present

## 2017-07-19 LAB — COMPREHENSIVE METABOLIC PANEL
ALBUMIN: 4.1 g/dL (ref 3.5–5.2)
ALT: 15 U/L (ref 0–53)
AST: 15 U/L (ref 0–37)
Alkaline Phosphatase: 77 U/L (ref 39–117)
BILIRUBIN TOTAL: 0.6 mg/dL (ref 0.2–1.2)
BUN: 19 mg/dL (ref 6–23)
CALCIUM: 9.1 mg/dL (ref 8.4–10.5)
CO2: 29 meq/L (ref 19–32)
CREATININE: 1.03 mg/dL (ref 0.40–1.50)
Chloride: 105 mEq/L (ref 96–112)
GFR: 73.52 mL/min (ref >60.00)
Glucose, Bld: 95 mg/dL (ref 70–99)
Potassium: 4.7 mEq/L (ref 3.5–5.1)
Sodium: 140 mEq/L (ref 135–145)
TOTAL PROTEIN: 6.7 g/dL (ref 6.0–8.3)

## 2017-07-19 LAB — LIPID PANEL
CHOL/HDL RATIO: 3
Cholesterol: 153 mg/dL (ref 0–200)
HDL: 45.9 mg/dL (ref >39.00)
LDL CALC: 91 mg/dL (ref 0–99)
NONHDL: 106.8
TRIGLYCERIDES: 81 mg/dL (ref 0.0–149.0)
VLDL: 16.2 mg/dL (ref 0.0–40.0)

## 2017-07-19 NOTE — Patient Instructions (Signed)
Nice to see you. Please try to get more exercise as we discussed. Please continue to see psychiatry. We will check lab work today and contact you with the results.

## 2017-07-19 NOTE — Assessment & Plan Note (Signed)
Check lipid panel.  Continue Lipitor. 

## 2017-07-19 NOTE — Assessment & Plan Note (Signed)
Seems to be improving.  He will continue to see psychiatry.

## 2017-07-19 NOTE — Assessment & Plan Note (Addendum)
Well-controlled today.  Continue current regimen.  Encouraged exercise.

## 2017-07-19 NOTE — Progress Notes (Signed)
  [name], MD Phone: [phone]  Shane Acevedo is a 84 y.o. male who presents today for f/u.  CC: htn, hld, anxiety  HYPERTENSION  Disease Monitoring  Home BP Monitoring not checking consistently Chest pain- no    Dyspnea- no Medications  Compliance-  Taking amlodipine.  Edema- no  HYPERLIPIDEMIA Symptoms Chest pain on exertion:  no    Medications: Compliance- taking lipitor Right upper quadrant pain- no  Muscle aches- no Patient wants to do more exercise to help with his anxiety and depression.  He thinks this will help him feel better as well overall.  Anxiety/depression: He is seeing psychiatry.  He continues on Lamictal and Remeron.  Feels better with his anxiety.  Some mild depression as he has a granddaughter who is ill currently.  He notes no SI.  He continues to see psychiatry.  Appears completed the urine collection for testing though it did not result.    Social History   Tobacco Use  Smoking Status Never Smoker  Smokeless Tobacco Never Used     ROS see history of present illness  Objective  Physical Exam Vitals:   07/19/17 1000  BP: 132/80  Pulse: (!) 53  Temp: 98.2 F (36.8 C)  SpO2: 96%    BP Readings from Last 3 Encounters:  07/19/17 132/80  03/01/17 (!) 149/81  02/15/17 (!) 148/70   Wt Readings from Last 3 Encounters:  07/19/17 168 lb 6.4 oz (76.4 kg)  03/01/17 162 lb 6.4 oz (73.7 kg)  02/15/17 162 lb 3.2 oz (73.6 kg)    Physical Exam  Constitutional: No distress.  Cardiovascular: Normal rate, regular rhythm and normal heart sounds.  Pulmonary/Chest: Effort normal and breath sounds normal.  Musculoskeletal: He exhibits no edema.  Neurological: He is alert.  Skin: Skin is warm and dry. He is not diaphoretic.     Assessment/Plan: Please see individual problem list.  Hypertension Well-controlled today.  Continue current regimen.  Encouraged exercise.  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  We will have lab staff check into this.  He will be given a jug to complete the testing again if we are unable to find his results.  We will complete aldosterone and renin testing.    Hyperlipidemia Check lipid panel.  Continue Lipitor.  Anxiety and depression Seems to be improving.  He will continue to see psychiatry.  Health maintenance: We are requesting pneumonia vaccine records.  Orders Placed This Encounter  Procedures  . Comp Met (CMET)  . Aldosterone + renin activity w/ ratio  . Lipid panel    No orders of the defined types were placed in this encounter.    [name], MD [place]

## 2017-07-19 NOTE — Assessment & Plan Note (Addendum)
Urine tests do not appear to have been run.  We will have lab staff check into this.  He will be given a jug to complete the testing again if we are unable to find his results.  We will complete aldosterone and renin testing.

## 2017-07-20 ENCOUNTER — Telehealth: Payer: Self-pay

## 2017-07-20 ENCOUNTER — Other Ambulatory Visit: Payer: Self-pay | Admitting: Family Medicine

## 2017-07-20 DIAGNOSIS — E782 Mixed hyperlipidemia: Secondary | ICD-10-CM

## 2017-07-20 MED ORDER — ATORVASTATIN CALCIUM 20 MG PO TABS: 20.0000 mg | ORAL_TABLET | Freq: Every day | ORAL | 1 refills | Status: DC

## 2017-07-20 NOTE — Telephone Encounter (Signed)
-----   Message from Leone Haven, MD sent at 07/20/2017  8:36 AM EDT ----- Please let the patient know that his LDL cholesterol is not quite at goal.  I would like to increase his Lipitor to 20 mg daily.  If he is willing I can send this to his pharmacy.  We would need to recheck his labs in 1 month.  Please place an order for an LDL and hepatic function panel.  Thanks.

## 2017-07-24 LAB — ALDOSTERONE + RENIN ACTIVITY W/ RATIO
ALDO / PRA RATIO: 8.6 ratio (ref 0.9–28.9)
Aldosterone: 5 ng/dL
RENIN ACTIVITY: 0.58 ng/mL/h (ref 0.25–5.82)

## 2017-07-25 ENCOUNTER — Other Ambulatory Visit: Payer: Self-pay | Admitting: Family Medicine

## 2017-08-10 ENCOUNTER — Telehealth: Payer: Self-pay | Admitting: Family Medicine

## 2017-08-10 DIAGNOSIS — N3281 Overactive bladder: Secondary | ICD-10-CM

## 2017-08-10 DIAGNOSIS — R350 Frequency of micturition: Secondary | ICD-10-CM

## 2017-08-10 DIAGNOSIS — N401 Enlarged prostate with lower urinary tract symptoms: Secondary | ICD-10-CM

## 2017-08-10 MED ORDER — OXYBUTYNIN CHLORIDE ER 5 MG PO TB24: 5.0000 mg | ORAL_TABLET | Freq: Every day | ORAL | 0 refills | Status: DC

## 2017-08-10 NOTE — Telephone Encounter (Signed)
Copied from [place] [phone]. Topic: Quick Communication - See Telephone Encounter >> Aug 10, 2017 11:17 AM Hewitt Shorts wrote: Pt is needing a refill on oxybutynin and pt would like it 90 days   [facility] [address]   Best number [phone]

## 2017-08-10 NOTE — Telephone Encounter (Signed)
Copied from [place] [phone]. Topic: Quick Communication - Rx Refill/Question >> Aug 10, 2017  8:24 AM Celedonio Savage L wrote: Medication: oxybutynin (DITROPAN-XL) 5 MG 24 hr tablet  90 day supply  Has the patient contacted their pharmacy? No.  I forward pt to mail service (Agent: If no, request that the patient contact the pharmacy for the refill.) (Agent: If yes, when and what did the pharmacy advise?)  Preferred Pharmacy (with phone number or street name):     [place], [place] [phone] (Phone) [phone] (Fax)      Agent: Please be advised that RX refills may take up to 3 business days. We ask that you follow-up with your pharmacy.

## 2017-08-18 ENCOUNTER — Other Ambulatory Visit: Payer: Self-pay | Admitting: Family Medicine

## 2017-08-23 ENCOUNTER — Other Ambulatory Visit: Payer: Self-pay | Admitting: Family Medicine

## 2017-08-23 ENCOUNTER — Other Ambulatory Visit (INDEPENDENT_AMBULATORY_CARE_PROVIDER_SITE_OTHER): Payer: Medicare Other

## 2017-08-23 DIAGNOSIS — E785 Hyperlipidemia, unspecified: Secondary | ICD-10-CM

## 2017-08-23 DIAGNOSIS — E782 Mixed hyperlipidemia: Secondary | ICD-10-CM | POA: Diagnosis not present

## 2017-08-23 LAB — HEPATIC FUNCTION PANEL
ALK PHOS: 80 U/L (ref 39–117)
ALT: 18 U/L (ref 0–53)
AST: 16 U/L (ref 0–37)
Albumin: 4 g/dL (ref 3.5–5.2)
Bilirubin, Direct: 0.2 mg/dL (ref 0.0–0.3)
Total Bilirubin: 0.6 mg/dL (ref 0.2–1.2)
Total Protein: 7.1 g/dL (ref 6.0–8.3)

## 2017-08-23 LAB — LDL CHOLESTEROL, DIRECT: LDL DIRECT: 99 mg/dL

## 2017-08-23 MED ORDER — ATORVASTATIN CALCIUM 40 MG PO TABS: 40.0000 mg | ORAL_TABLET | Freq: Every day | ORAL | 1 refills | Status: DC

## 2017-09-23 ENCOUNTER — Other Ambulatory Visit (INDEPENDENT_AMBULATORY_CARE_PROVIDER_SITE_OTHER): Payer: Medicare Other

## 2017-09-23 DIAGNOSIS — E785 Hyperlipidemia, unspecified: Secondary | ICD-10-CM

## 2017-09-23 LAB — HEPATIC FUNCTION PANEL
ALBUMIN: 4 g/dL (ref 3.5–5.2)
ALT: 14 U/L (ref 0–53)
AST: 12 U/L (ref 0–37)
Alkaline Phosphatase: 77 U/L (ref 39–117)
Bilirubin, Direct: 0.1 mg/dL (ref 0.0–0.3)
TOTAL PROTEIN: 6.6 g/dL (ref 6.0–8.3)
Total Bilirubin: 0.7 mg/dL (ref 0.2–1.2)

## 2017-09-23 LAB — LDL CHOLESTEROL, DIRECT: LDL DIRECT: 82 mg/dL

## 2017-09-28 ENCOUNTER — Telehealth: Payer: Self-pay

## 2017-09-28 NOTE — Telephone Encounter (Signed)
Copied from [place] [id number]. Topic: Quick Communication - Lab Results >> Sep 28, 2017 12:53 PM Scherrie Gerlach wrote: Pt following up on lab results.  Pt states a month ago his meds were changed, so he just wanted to know how this was doing now.

## 2017-10-15 ENCOUNTER — Ambulatory Visit (INDEPENDENT_AMBULATORY_CARE_PROVIDER_SITE_OTHER): Payer: Medicare Other

## 2017-10-15 VITALS — BP 122/70 | HR 60 | Temp 98.3°F | Resp 15 | Ht 68.0 in | Wt 169.1 lb

## 2017-10-15 DIAGNOSIS — Z0000 Encounter for general adult medical examination without abnormal findings: Secondary | ICD-10-CM

## 2017-10-15 DIAGNOSIS — Z23 Encounter for immunization: Secondary | ICD-10-CM | POA: Diagnosis not present

## 2017-10-15 NOTE — Progress Notes (Signed)
Subjective:   Camara Rosander is a 84 y.o. male who presents for an Initial Medicare Annual Wellness Visit.  Review of Systems    No ROS.  Medicare Wellness Visit. Additional risk factors are reflected in the social history.  Cardiac Risk Factors include: advanced age (>14men, >38 women);hypertension;male gender     Objective:    Today's Vitals   10/15/17 0924  BP: 122/70  Pulse: 60  Resp: 15  Temp: 98.3 F (36.8 C)  TempSrc: Oral  SpO2: 96%  Weight: 169 lb 1.9 oz (76.7 kg)  Height: 5\' 8"  (1.727 m)   Body mass index is 25.71 kg/m.  Advanced Directives 10/15/2017  Does Patient Have a Medical Advance Directive? Yes  Type of Paramedic of [place];Living will  Does patient want to make changes to medical advance directive? No - Patient declined  Copy of Vera in Chart? No - copy requested  Some encounter information is confidential and restricted. Go to Review Flowsheets activity to see all data.    Current Medications (verified) Outpatient Encounter Medications as of 10/15/2017  Medication Sig  . amLODipine (NORVASC) 10 MG tablet Take 1 tablet (10 mg total) by mouth daily.  Marland Kitchen aspirin EC 81 MG tablet Take 81 mg by mouth daily.  Marland Kitchen atorvastatin (LIPITOR) 40 MG tablet Take 1 tablet (40 mg total) by mouth daily.  Marland Kitchen lamoTRIgine (LAMICTAL) 200 MG tablet Take 0.5 tablets (100 mg total) by mouth daily with supper.  . mirtazapine (REMERON) 7.5 MG tablet Take 1 tablet (7.5 mg total) by mouth at bedtime.  . Multiple Vitamins-Minerals (CENTRUM SILVER PO) Take by mouth.  Marland Kitchen omeprazole (PRILOSEC) 20 MG capsule TAKE 1 CAPSULE DAILY  . oxybutynin (DITROPAN-XL) 5 MG 24 hr tablet Take 1 tablet (5 mg total) by mouth at bedtime.   No facility-administered encounter medications on file as of 10/15/2017.     Allergies (verified) Patient has no known allergies.   History: Past Medical History:  Diagnosis Date  . Allergy   . Anxiety    . Depression   . Heart disease   . Hypertension    Past Surgical History:  Procedure Laterality Date  . CORONARY STENT PLACEMENT Right   . TONSILLECTOMY    . TOTAL HIP ARTHROPLASTY Right    Family History  Problem Relation Age of Onset  . Alcoholism Other   . Arthritis Other   . Lung cancer Other   . Mental illness Other   . Emphysema Mother   . Bipolar disorder Father   . Coronary artery disease Sister   . Schizophrenia Sister    Social History   Socioeconomic History  . Marital status: Married    Spouse name: Not on file  . Number of children: Not on file  . Years of education: Not on file  . Highest education level: Not on file  Occupational History  . Not on file  Social Needs  . Financial resource strain: Not hard at all  . Food insecurity:    Worry: Never true    Inability: Never true  . Transportation needs:    Medical: No    Non-medical: No  Tobacco Use  . Smoking status: Never Smoker  . Smokeless tobacco: Never Used  Substance and Sexual Activity  . Alcohol use: Yes    Alcohol/week: 4.0 standard drinks    Types: 2 Glasses of wine, 2 Cans of beer per week  . Drug use: No  . Sexual activity:  Not Currently  Lifestyle  . Physical activity:    Days per week: 2 days    Minutes per session: 60 min  . Stress: Not at all  Relationships  . Social connections:    Talks on phone: Not on file    Gets together: Not on file    Attends religious service: Not on file    Active member of club or organization: Not on file    Attends meetings of clubs or organizations: Not on file    Relationship status: Not on file  Other Topics Concern  . Not on file  Social History Narrative  . Not on file    Tobacco Counseling Counseling given: Not Answered   Clinical Intake:  Pre-visit preparation completed: Yes  Pain : No/denies pain     Nutritional Status: BMI 25 -29 Overweight Diabetes: No  How often do you need to have someone help you when you read  instructions, pamphlets, or other written materials from your doctor or pharmacy?: 1 - Never  Interpreter Needed?: No      Activities of Daily Living In your present state of health, do you have any difficulty performing the following activities: 10/15/2017  Hearing? N  Vision? N  Difficulty concentrating or making decisions? N  Walking or climbing stairs? N  Dressing or bathing? N  Doing errands, shopping? N  Preparing Food and eating ? N  Using the Toilet? N  In the past six months, have you accidently leaked urine? N  Do you have problems with loss of bowel control? N  Managing your Medications? N  Managing your Finances? N  Housekeeping or managing your Housekeeping? N  Some recent data might be hidden     Immunizations and Health Maintenance Immunization History  Administered Date(s) Administered  . Influenza, High Dose Seasonal PF 10/05/2016, 10/15/2017   Health Maintenance Due  Topic Date Due  . TETANUS/TDAP  10/04/1954  . PNA vac Low Risk Adult (1 of 2 - PCV13) 10/03/2000    Patient Care Team: Leone Haven, MD as PCP - General (Family Medicine)  Indicate any recent Medical Services you may have received from other than [facility] providers in the past year (date may be approximate).     Assessment:   This is a routine wellness examination for Chai.  The goal of the wellness visit is to assist the patient how to close the gaps in care and create a preventative care plan for the patient.   The roster of all physicians providing medical care to patient is listed in the Snapshot section of the chart.  Osteoporosis risk reviewed.    Safety issues reviewed; Smoke and carbon monoxide detectors in the home. No firearms in the home. Wears seatbelts when driving or riding with others. No violence in the home.  They do not have excessive sun exposure.  Discussed the need for sun protection: hats, long sleeves and the use of sunscreen if there is significant sun  exposure.  No new identified risk were noted.  No failures at ADL's or IADL's.    BMI- discussed the importance of a healthy diet, water intake and the benefits of aerobic exercise. Educational material provided.   Dental- every 12 months.  Medications- taking all scheduled medications as directed.  He wants to continue to hold off on starting Crestor trial while he has an abundance of Lipitor.  Eye- Visual acuity not assessed per patient preference since they have regular follow up with the ophthalmologist.  Wears corrective lenses.  Sleep patterns- Sleeps fair about 6 hours. Awaiting CPAP results.   High dose influenza vaccine administered R deltoid, tolerated well. No verbal complaint during or after administration. Educational material provided.  TDAP vaccine deferred per patient preference.  Follow up with insurance.  Educational material provided.  Prevnar 13 vaccine discussed.   Patient Concerns: None at this time. Follow up with PCP as needed.  Hearing/Vision screen  Visual Acuity Screening   Right eye Left eye Both eyes  Without correction:     With correction:   20/20  Comments: Wears corrective lenses   Hearing Screening Comments: Patient is able to hear conversational tones without difficulty.  No issues reported.   Dietary issues and exercise activities discussed: Current Exercise Habits: Structured exercise class, Time (Minutes): 60, Frequency (Times/Week): 2, Weekly Exercise (Minutes/Week): 120, Intensity: Moderate  Goals      Patient Stated   . Low cholesterol diet (pt-stated)      Other   . Increase physical activity     Stay active      Depression Screen PHQ 2/9 Scores 10/15/2017 07/03/2016  PHQ - 2 Score 0 6  PHQ- 9 Score - 12    Fall Risk Fall Risk  10/15/2017 07/19/2017 07/03/2016  Falls in the past year? No No No   Cognitive Function:     6CIT Screen 10/15/2017  What Year? 0 points  What month? 0 points  What time? 0 points  Count back  from 20 0 points  Months in reverse 0 points  Repeat phrase 0 points  Total Score 0    Screening Tests Health Maintenance  Topic Date Due  . TETANUS/TDAP  10/04/1954  . PNA vac Low Risk Adult (1 of 2 - PCV13) 10/03/2000  . INFLUENZA VACCINE  Completed      Plan:    End of life planning; Advance aging; Advanced directives discussed. Copy of current HCPOA/Living Will requested.    I have personally reviewed and noted the following in the patient's chart:   . Medical and social history . Use of alcohol, tobacco or illicit drugs  . Current medications and supplements . Functional ability and status . Nutritional status . Physical activity . Advanced directives . List of other physicians . Hospitalizations, surgeries, and ER visits in previous 12 months . Vitals . Screenings to include cognitive, depression, and falls . Referrals and appointments  In addition, I have reviewed and discussed with patient certain preventive protocols, quality metrics, and best practice recommendations. A written personalized care plan for preventive services as well as general preventive health recommendations were provided to patient.     Varney Biles, LPN   [id number]/08/[id number]

## 2017-10-15 NOTE — Progress Notes (Signed)
I have reviewed the above note and agree.  Oluwaseun Bruyere, M.D.  

## 2017-10-15 NOTE — Patient Instructions (Addendum)
  Shane Acevedo , Thank you for taking time to come for your Medicare Wellness Visit. I appreciate your ongoing commitment to your health goals. Please review the following plan we discussed and let me know if I can assist you in the future.   Follow up as needed.    Bring a copy of your [place] and/or Living Will to be scanned into chart.  Have a great day!  These are the goals we discussed: Goals      Patient Stated   . Low cholesterol diet (pt-stated)      Other   . Increase physical activity     Stay active       This is a list of the screening recommended for you and due dates:  Health Maintenance  Topic Date Due  . Tetanus Vaccine  10[birth year]04/1954  . Pneumonia vaccines (1 of 2 - PCV13) 10/03/2000  . Flu Shot  Completed

## 2017-10-18 ENCOUNTER — Encounter: Payer: Self-pay | Admitting: Psychiatry

## 2017-10-18 ENCOUNTER — Other Ambulatory Visit: Payer: Self-pay

## 2017-10-18 ENCOUNTER — Ambulatory Visit (INDEPENDENT_AMBULATORY_CARE_PROVIDER_SITE_OTHER): Payer: Medicare Other | Admitting: Psychiatry

## 2017-10-18 VITALS — BP 143/80 | HR 67 | Temp 98.0°F | Ht 68.0 in | Wt 171.0 lb

## 2017-10-18 DIAGNOSIS — F411 Generalized anxiety disorder: Secondary | ICD-10-CM | POA: Diagnosis not present

## 2017-10-18 DIAGNOSIS — G4701 Insomnia due to medical condition: Secondary | ICD-10-CM | POA: Diagnosis not present

## 2017-10-18 DIAGNOSIS — I6523 Occlusion and stenosis of bilateral carotid arteries: Secondary | ICD-10-CM

## 2017-10-18 NOTE — Progress Notes (Signed)
[facility] MD OP Progress Note  10/18/2017 1:36 PM Jerico Grisso  MRN:  [id number]  Chief Complaint: ' I am here for follow up.' Chief Complaint    Follow-up; Medication Refill     HPI: Shane Acevedo is an 84 yr old Caucasian male, married, lives in [place] [facility] , has a history of anxiety as well as mood symptoms, insomnia, presented to the clinic today for a follow-up visit.  Patient today reports his mood symptoms as fair on the current medication regimen.  He continues to be compliant with Lamictal and mirtazapine.  He denies any side effects to the medication.  He reports sleep is fair.  He reports appetite is fair.  He remains active in various activities at the senior living community.  He also reports he continues to take care of his wife who has multiple medical problems.  Patient continues to follow-up with his primary medical doctor.  His blood pressure today seems to be improved than last visit.  He reports he had follow-up visits with his PMD who is managing it.  Patient denies any suicidality, homicidality or perceptual disturbances.  Visit Diagnosis:    ICD-10-CM   1. GAD (generalized anxiety disorder) F41.1   2. Insomnia due to medical condition G47.01     Past Psychiatric History: Reviewed past psychiatric history from my progress note on 04/08/2017  Past Medical History:  Past Medical History:  Diagnosis Date  . Allergy   . Anxiety   . Depression   . Heart disease   . Hypertension     Past Surgical History:  Procedure Laterality Date  . CORONARY STENT PLACEMENT Right   . TONSILLECTOMY    . TOTAL HIP ARTHROPLASTY Right     Family Psychiatric History: Reviewed family psychiatric history from my progress note on 04/08/2017  Family History:  Family History  Problem Relation Age of Onset  . Alcoholism Other   . Arthritis Other   . Lung cancer Other   . Mental illness Other   . Emphysema Mother   . Bipolar disorder Father   . Coronary  artery disease Sister   . Schizophrenia Sister     Social History: Reviewed social history from my progress note on 04/08/2017 Social History   Socioeconomic History  . Marital status: Married    Spouse name: Not on file  . Number of children: Not on file  . Years of education: Not on file  . Highest education level: Not on file  Occupational History  . Not on file  Social Needs  . Financial resource strain: Not hard at all  . Food insecurity:    Worry: Never true    Inability: Never true  . Transportation needs:    Medical: No    Non-medical: No  Tobacco Use  . Smoking status: Never Smoker  . Smokeless tobacco: Never Used  Substance and Sexual Activity  . Alcohol use: Yes    Alcohol/week: 4.0 standard drinks    Types: 2 Glasses of wine, 2 Cans of beer per week  . Drug use: No  . Sexual activity: Not Currently  Lifestyle  . Physical activity:    Days per week: 2 days    Minutes per session: 60 min  . Stress: Not at all  Relationships  . Social connections:    Talks on phone: Not on file    Gets together: Not on file    Attends religious service: Not on file    Active member  of club or organization: Not on file    Attends meetings of clubs or organizations: Not on file    Relationship status: Not on file  Other Topics Concern  . Not on file  Social History Narrative  . Not on file    Allergies: No Known Allergies  Metabolic Disorder Labs: No results found for: HGBA1C, MPG No results found for: PROLACTIN Lab Results  Component Value Date   CHOL 153 07/19/2017   TRIG 81.0 07/19/2017   HDL 45.90 07/19/2017   CHOLHDL 3 07/19/2017   VLDL 16.2 07/19/2017   LDLCALC 91 07/19/2017   No results found for: TSH  Therapeutic Level Labs: No results found for: LITHIUM No results found for: VALPROATE No components found for:  CBMZ  Current Medications: Current Outpatient Medications  Medication Sig Dispense Refill  . amLODipine (NORVASC) 10 MG tablet Take 1  tablet (10 mg total) by mouth daily. 90 tablet 3  . aspirin EC 81 MG tablet Take 81 mg by mouth daily.    Marland Kitchen atorvastatin (LIPITOR) 40 MG tablet Take 1 tablet (40 mg total) by mouth daily. 90 tablet 1  . lamoTRIgine (LAMICTAL) 200 MG tablet Take 0.5 tablets (100 mg total) by mouth daily with supper. 45 tablet 1  . mirtazapine (REMERON) 7.5 MG tablet Take 1 tablet (7.5 mg total) by mouth at bedtime. 90 tablet 1  . Multiple Vitamins-Minerals (CENTRUM SILVER PO) Take by mouth.    Marland Kitchen omeprazole (PRILOSEC) 20 MG capsule TAKE 1 CAPSULE DAILY 90 capsule 1  . oxybutynin (DITROPAN-XL) 5 MG 24 hr tablet Take 1 tablet (5 mg total) by mouth at bedtime. 90 tablet 0   No current facility-administered medications for this visit.      Musculoskeletal: Strength & Muscle Tone: within normal limits Gait & Station: normal Patient leans: N/A  Psychiatric Specialty Exam: Review of Systems  Psychiatric/Behavioral: The patient is nervous/anxious.   All other systems reviewed and are negative.   Blood pressure (!) 143/80, pulse 67, temperature 98 F (36.7 C), temperature source Oral, height 5\' 8"  (1.727 m), weight 171 lb (77.6 kg).Body mass index is 26 kg/m.  General Appearance: Casual  Eye Contact:  Fair  Speech:  Clear and Coherent  Volume:  Normal  Mood:  Anxious  Affect:  Congruent  Thought Process:  Goal Directed and Descriptions of Associations: Intact  Orientation:  Full (Time, Place, and Person)  Thought Content: Logical   Suicidal Thoughts:  No  Homicidal Thoughts:  No  Memory:  Immediate;   Fair Recent;   Fair Remote;   Fair  Judgement:  Fair  Insight:  Fair  Psychomotor Activity:  Normal  Concentration:  Concentration: Fair and Attention Span: Fair  Recall:  [employer] of Knowledge: Fair  Language: Fair  Akathisia:  No  Handed:  Right  AIMS (if indicated): NA  Assets:  Communication Skills Desire for Improvement Housing  ADL's:  Intact  Cognition: WNL  Sleep:  Fair    Screenings: PHQ2-9     Clinical Support from 10/15/2017 in [place] Office Visit from 07/03/2016 in [place]  PHQ-2 Total Score  0  6  PHQ-9 Total Score  -  12       Assessment and Plan: Spurgeon is an 84 yr old Caucasian male who has a history of anxiety, insomnia, OSA as well as medical problems like coronary artery disease, hypertension, presented to the clinic today for a follow-up visit.  Patient today reports  he is currently doing well on the current medication regimen.  He denies any significant mood lability.  Will continue plan as noted below.  Plan For anxiety  Remeron 7.5 mg p.o. nightly Continue Lamictal 100 mg p.o. daily  For insomnia Continue Remeron 7.5 mg p.o. nightly  Follow-up in clinic in 3 months or sooner if needed.  More than 50 % of the time was spent for psychoeducation and supportive psychotherapy and care coordination.  This note was generated in part or whole with voice recognition software. Voice recognition is usually quite accurate but there are transcription errors that can and very often do occur. I apologize for any typographical errors that were not detected and corrected.       Ursula Alert, MD 10/18/2017, 1:36 PM

## 2017-11-16 ENCOUNTER — Other Ambulatory Visit: Payer: Self-pay | Admitting: Family Medicine

## 2017-11-16 DIAGNOSIS — R350 Frequency of micturition: Secondary | ICD-10-CM

## 2017-11-16 DIAGNOSIS — N3281 Overactive bladder: Secondary | ICD-10-CM

## 2017-11-16 DIAGNOSIS — N401 Enlarged prostate with lower urinary tract symptoms: Secondary | ICD-10-CM

## 2017-11-16 NOTE — Telephone Encounter (Signed)
Last OV 07/19/2017   Last refilled 08/10/2017 disp 90 with no refills   Sent to PCP for approval

## 2017-12-22 NOTE — Progress Notes (Signed)
Cardiology Office Note  Date:  12/23/2017   ID:  Shane Acevedo, DOB [birth year], MRN [id number]  PCP:  Shane Haven, MD   Chief Complaint  Patient presents with  . other    6 month follow up. Pt. c/o shortness of breath with bending over and has soreness in knees, hips, shoulders and fingers.     HPI:  Shane Acevedo is a 84 y.o. male  with a history of CAD, stent 1997, PDA, was having angina/SOB pain back then on running previously managed in [place], [place] Carotid stenosis, 50% b/l Smoker, remote 54 - 16 yo Pulmonary embolism after right hip surgery HTN History of anxiety and depression Who presents for f/u of his coronary disease, and carotid arterial disease  In follow-up today he reports having chronic Shoulder, knee and hand arthritis, Worse recently Wonders if it could be from higher dose Lipitor   weight up 18 pounds, since moving to the area 10 pounds recently, past year Poor diet  Walks dog, water aerobics Swam 30 laps, sometimes twice a week Denies any shortness of breath or chest pain on exertion   periodic  feeling as though food is sticking in his throat.   Wife has debilitating medical issues As to care for her on a regular basis  EKG personally reviewed by myself on todays visit Shows normal sinus rhythm with rate 52 bpm no significant ST or T wave changes  Past medical history reviewed Previous anginal symptoms discussed with him, had chest discomfort and shortness of breath  stent placement 1997 denies having any interventions since that time  Previous testing reviewed  Treadmill stress test 11/2015 Echo 03/2015: normal EF, mild AI and MR Carotid u/s 03/2015: mild to moderate b/l    PMH:   has a past medical history of Allergy, Anxiety, Depression, Heart disease, and Hypertension.  PSH:    Past Surgical History:  Procedure Laterality Date  . CORONARY STENT PLACEMENT Right   . TONSILLECTOMY    . TOTAL HIP ARTHROPLASTY  Right     Current Outpatient Medications  Medication Sig Dispense Refill  . amLODipine (NORVASC) 10 MG tablet Take 1 tablet (10 mg total) by mouth daily. 90 tablet 3  . aspirin EC 81 MG tablet Take 81 mg by mouth daily.    Marland Kitchen atorvastatin (LIPITOR) 40 MG tablet Take 1 tablet (40 mg total) by mouth daily. 90 tablet 1  . lamoTRIgine (LAMICTAL) 200 MG tablet Take 0.5 tablets (100 mg total) by mouth daily with supper. 45 tablet 1  . mirtazapine (REMERON) 7.5 MG tablet Take 1 tablet (7.5 mg total) by mouth at bedtime. 90 tablet 1  . Multiple Vitamins-Minerals (CENTRUM SILVER PO) Take by mouth.    Marland Kitchen omeprazole (PRILOSEC) 20 MG capsule TAKE 1 CAPSULE DAILY 90 capsule 1  . oxybutynin (DITROPAN-XL) 5 MG 24 hr tablet TAKE 1 TABLET AT BEDTIME 90 tablet 4   No current facility-administered medications for this visit.      Allergies:   Patient has no known allergies.   Social History:  The patient  reports that he has never smoked. He has never used smokeless tobacco. He reports current alcohol use of about 4.0 standard drinks of alcohol per week. He reports that he does not use drugs.   Family History:   family history includes Alcoholism in an other family member; Arthritis in an other family member; Bipolar disorder in his father; Coronary artery disease in his sister; Emphysema in his mother; Lung  cancer in an other family member; Mental illness in an other family member; Schizophrenia in his sister.    Review of Systems: Review of Systems  Constitutional: Negative.   Respiratory: Negative.   Cardiovascular: Negative.   Gastrointestinal: Negative.   Musculoskeletal: Positive for joint pain.       Hand pain  Neurological: Negative.   Psychiatric/Behavioral: Negative.   All other systems reviewed and are negative.    PHYSICAL EXAM: VS:  BP 130/70 (BP Location: Left Arm, Patient Position: Sitting, Cuff Size: Normal)   Pulse (!) 52   Ht 5\' 8"  (1.727 m)   Wt 167 lb 12 oz (76.1 kg)   BMI  25.51 kg/m  , BMI Body mass index is 25.51 kg/m. Constitutional:  oriented to person, place, and time. No distress.  HENT:  Head: Grossly normal Eyes:  no discharge. No scleral icterus.  Neck: No JVD, no carotid bruits  Cardiovascular: Regular rate and rhythm, no murmurs appreciated Pulmonary/Chest: Clear to auscultation bilaterally, no wheezes or rails Abdominal: Soft.  no distension.  no tenderness.  Musculoskeletal: Normal range of motion Neurological:  normal muscle tone. Coordination normal. No atrophy Skin: Skin warm and dry Psychiatric: normal affect, pleasant   Recent Labs: 01/28/2017: Hemoglobin 14.4; Platelets 239.0 07/19/2017: BUN 19; Creatinine, Ser 1.03; Potassium 4.7; Sodium 140 09/23/2017: ALT 14    Lipid Panel Lab Results  Component Value Date   CHOL 153 07/19/2017   HDL 45.90 07/19/2017   LDLCALC 91 07/19/2017   TRIG 81.0 07/19/2017      Wt Readings from Last 3 Encounters:  12/23/17 167 lb 12 oz (76.1 kg)  10/15/17 169 lb 1.9 oz (76.7 kg)  07/19/17 168 lb 6.4 oz (76.4 kg)       ASSESSMENT AND PLAN:  Coronary artery disease of native artery of native heart with stable angina pectoris ([place]) - Plan: EKG 12-Lead Denies chest pain concerning for angina Recommended more regular exercise program given dramatic weight gain over the past year, at least 10 pounds  OSA (obstructive sleep apnea) - Plan: EKG 12-Lead Reports sleeping well  Mixed hyperlipidemia - Plan: EKG 12-Lead Continue Lipitor 40, order placed to check lipids today May need to add Zetia  History of coronary artery stent placement Prior history of stent 20 years ago  Typically would continue Plavix with an older stent.  Came to our office last year only on aspirin.  We will continue aspirin alone  Essential hypertension Blood pressure is well controlled on today's visit. No changes made to the medications.  Anxiety and depression Recommend he continue regular exercise  program Recommended he increase his water aerobics  Smoking history Remote smoking, none recently  Disposition:   F/U  12 months   Total encounter time more than 25 minutes  Greater than 50% was spent in counseling and coordination of care with the patient    Orders Placed This Encounter  Procedures  . Lipid panel  . Hepatic function panel  . EKG 12-Lead     Signed, Esmond Plants, M.D., Ph.D. 12/23/2017  [place], [place]

## 2017-12-23 ENCOUNTER — Ambulatory Visit (INDEPENDENT_AMBULATORY_CARE_PROVIDER_SITE_OTHER): Payer: Medicare Other | Admitting: Cardiovascular Disease

## 2017-12-23 ENCOUNTER — Encounter: Payer: Self-pay | Admitting: Cardiovascular Disease

## 2017-12-23 VITALS — BP 130/70 | HR 52 | Ht 68.0 in | Wt 167.8 lb

## 2017-12-23 DIAGNOSIS — I700 Atherosclerosis of aorta: Secondary | ICD-10-CM | POA: Diagnosis not present

## 2017-12-23 DIAGNOSIS — I6523 Occlusion and stenosis of bilateral carotid arteries: Secondary | ICD-10-CM | POA: Diagnosis not present

## 2017-12-23 DIAGNOSIS — F419 Anxiety disorder, unspecified: Secondary | ICD-10-CM

## 2017-12-23 DIAGNOSIS — Z79899 Other long term (current) drug therapy: Secondary | ICD-10-CM | POA: Diagnosis not present

## 2017-12-23 DIAGNOSIS — E782 Mixed hyperlipidemia: Secondary | ICD-10-CM | POA: Diagnosis not present

## 2017-12-23 DIAGNOSIS — G4733 Obstructive sleep apnea (adult) (pediatric): Secondary | ICD-10-CM

## 2017-12-23 DIAGNOSIS — I10 Essential (primary) hypertension: Secondary | ICD-10-CM | POA: Diagnosis not present

## 2017-12-23 DIAGNOSIS — Z87891 Personal history of nicotine dependence: Secondary | ICD-10-CM

## 2017-12-23 DIAGNOSIS — F32A Depression, unspecified: Secondary | ICD-10-CM

## 2017-12-23 DIAGNOSIS — I25118 Atherosclerotic heart disease of native coronary artery with other forms of angina pectoris: Secondary | ICD-10-CM | POA: Diagnosis not present

## 2017-12-23 DIAGNOSIS — F329 Major depressive disorder, single episode, unspecified: Secondary | ICD-10-CM

## 2017-12-23 NOTE — Patient Instructions (Addendum)
If you get side effects from higher dose atorvastatin, We could lower the dose back to 20 mg And add zetia 10 mg once a day   Medication Instructions:  No changes  If you need a refill on your cardiac medications before your next appointment, please call your pharmacy.    Lab work: Your physician recommends that you return for lab work in: TODAY - LIPID, LIVER.    If you have labs (blood work) drawn today and your tests are completely normal, you will receive your results only by: Marland Kitchen MyChart Message (if you have MyChart) OR . A paper copy in the mail If you have any lab test that is abnormal or we need to change your treatment, we will call you to review the results.   Testing/Procedures: No new testing needed   Follow-Up: At [facility], you and your health needs are our priority.  As part of our continuing mission to provide you with exceptional heart care, we have created designated Provider Care Teams.  These Care Teams include your primary Cardiologist (physician) and Advanced Practice Providers (APPs -  Physician Assistants and Nurse Practitioners) who all work together to provide you with the care you need, when you need it.  . You will need a follow up appointment in 12 months .   Please call our office 2 months in advance to schedule this appointment.    . Providers on your designated Care Team:   . Murray Hodgkins, NP . Christell Faith, PA-C . Marrianne Mood, PA-C  Any Other Special Instructions Will Be Listed Below (If Applicable).  For educational health videos Log in to : [email or web address] Or : [email or web address], password : [email or web address]

## 2017-12-24 LAB — HEPATIC FUNCTION PANEL
ALT: 17 IU/L (ref 0–44)
AST: 14 IU/L (ref 0–40)
Albumin: 4.1 g/dL (ref 3.5–4.7)
Alkaline Phosphatase: 92 IU/L (ref 39–117)
BILIRUBIN, DIRECT: 0.18 mg/dL (ref 0.00–0.40)
Bilirubin Total: 0.6 mg/dL (ref 0.0–1.2)
Total Protein: 6.5 g/dL (ref 6.0–8.5)

## 2017-12-24 LAB — LIPID PANEL
CHOL/HDL RATIO: 3.1 ratio (ref 0.0–5.0)
Cholesterol, Total: 143 mg/dL (ref 100–199)
HDL: 46 mg/dL (ref >39)
LDL Calculated: 81 mg/dL (ref 0–99)
TRIGLYCERIDES: 80 mg/dL (ref 0–149)
VLDL CHOLESTEROL CAL: 16 mg/dL (ref 5–40)

## 2017-12-27 ENCOUNTER — Telehealth: Payer: Self-pay | Admitting: *Deleted

## 2017-12-27 NOTE — Telephone Encounter (Signed)
No answer. Left message to call back.   

## 2017-12-27 NOTE — Telephone Encounter (Signed)
-----   Message from Minna Merritts, MD sent at 12/25/2017  3:49 PM EST ----- Lipids LDL is 81, Ideally would like 60, Would he like to add zetia 10 mg daily to the lipitor 40 daily?

## 2017-12-28 MED ORDER — EZETIMIBE 10 MG PO TABS: 10.0000 mg | ORAL_TABLET | Freq: Every day | ORAL | 3 refills | Status: DC

## 2017-12-28 NOTE — Telephone Encounter (Signed)
Results called to pt. Pt verbalized understanding. He is agreeable to take zetia 10 mg once a day. Rx sent to verified pharmacy.

## 2018-01-17 ENCOUNTER — Ambulatory Visit: Payer: Medicare Other | Admitting: Psychiatry

## 2018-01-21 ENCOUNTER — Other Ambulatory Visit: Payer: Self-pay | Admitting: Family Medicine

## 2018-01-24 ENCOUNTER — Ambulatory Visit: Payer: Medicare Other | Admitting: Family Medicine

## 2018-01-25 ENCOUNTER — Ambulatory Visit: Payer: Medicare Other | Admitting: Family Medicine

## 2018-02-02 ENCOUNTER — Ambulatory Visit (INDEPENDENT_AMBULATORY_CARE_PROVIDER_SITE_OTHER): Payer: Medicare Other | Admitting: Psychiatry

## 2018-02-02 ENCOUNTER — Encounter: Payer: Self-pay | Admitting: Psychiatry

## 2018-02-02 VITALS — BP 134/76 | HR 62 | Ht 67.5 in | Wt 171.0 lb

## 2018-02-02 DIAGNOSIS — F411 Generalized anxiety disorder: Secondary | ICD-10-CM | POA: Diagnosis not present

## 2018-02-02 DIAGNOSIS — G4701 Insomnia due to medical condition: Secondary | ICD-10-CM | POA: Diagnosis not present

## 2018-02-02 NOTE — Progress Notes (Signed)
[facility] MD OP Progress Note  02/02/2018 5:12 PM Shane Acevedo  MRN:  [id number]  Chief Complaint: ' I am here for follow up.' Chief Complaint    Follow-up     HPI: Shane Acevedo is an 85 year old Caucasian male, married, lives in [place], has a history of anxiety as well as mood symptoms, insomnia, presented to the clinic today for a follow-up visit.  Patient today reports he continues to struggle with some anxiety symptoms on and off.  He reports he is active at his senior living community and hence has to keep up with projects that he is involved in.  This kind of makes him anxious however he has been coping with it better than before.  He reports sleep was restless on and off.  He reports there are nights that he stays up reading a book and goes to bed late but still have to wake up early in the morning.  He reports he is working on his sleep hygiene.  He does not want any medications to help with the sleep since he already has mirtazapine.  He reports he is okay with the mirtazapine dosage and does not want any readjustment today.  He denies any suicidality.  He denies any perceptual disturbances.  Patient denies any other concerns today. Visit Diagnosis:    ICD-10-CM   1. GAD (generalized anxiety disorder) F41.1    improving  2. Insomnia due to medical condition G47.01     Past Psychiatric History: Reviewed past psychiatric history from my progress note on 04/08/2017.  Past Medical History:  Past Medical History:  Diagnosis Date  . Allergy   . Anxiety   . Depression   . Heart disease   . Hypertension     Past Surgical History:  Procedure Laterality Date  . CORONARY STENT PLACEMENT Right   . TONSILLECTOMY    . TOTAL HIP ARTHROPLASTY Right     Family Psychiatric History: Reviewed family psychiatric history from my progress note on 04/08/2017.  Family History:  Family History  Problem Relation Age of Onset  . Alcoholism Other   . Arthritis Other   .  Lung cancer Other   . Mental illness Other   . Emphysema Mother   . Bipolar disorder Father   . Coronary artery disease Sister   . Schizophrenia Sister     Social History: Reviewed social history from my progress note on 04/08/2017. Social History   Socioeconomic History  . Marital status: Married    Spouse name: Not on file  . Number of children: Not on file  . Years of education: Not on file  . Highest education level: Not on file  Occupational History  . Not on file  Social Needs  . Financial resource strain: Not hard at all  . Food insecurity:    Worry: Never true    Inability: Never true  . Transportation needs:    Medical: No    Non-medical: No  Tobacco Use  . Smoking status: Never Smoker  . Smokeless tobacco: Never Used  Substance and Sexual Activity  . Alcohol use: Yes    Alcohol/week: 4.0 standard drinks    Types: 2 Glasses of wine, 2 Cans of beer per week  . Drug use: No  . Sexual activity: Not Currently  Lifestyle  . Physical activity:    Days per week: 2 days    Minutes per session: 60 min  . Stress: Not at all  Relationships  . Social  connections:    Talks on phone: Not on file    Gets together: Not on file    Attends religious service: Not on file    Active member of club or organization: Not on file    Attends meetings of clubs or organizations: Not on file    Relationship status: Not on file  Other Topics Concern  . Not on file  Social History Narrative  . Not on file    Allergies: No Known Allergies  Metabolic Disorder Labs: No results found for: HGBA1C, MPG No results found for: PROLACTIN Lab Results  Component Value Date   CHOL 143 12/23/2017   TRIG 80 12/23/2017   HDL 46 12/23/2017   CHOLHDL 3.1 12/23/2017   VLDL 16.2 07/19/2017   LDLCALC 81 12/23/2017   LDLCALC 91 07/19/2017   No results found for: TSH  Therapeutic Level Labs: No results found for: LITHIUM No results found for: VALPROATE No components found for:   CBMZ  Current Medications: Current Outpatient Medications  Medication Sig Dispense Refill  . amLODipine (NORVASC) 10 MG tablet Take 1 tablet (10 mg total) by mouth daily. 90 tablet 3  . aspirin EC 81 MG tablet Take 81 mg by mouth daily.    Marland Kitchen atorvastatin (LIPITOR) 40 MG tablet Take 1 tablet (40 mg total) by mouth daily. 90 tablet 1  . ezetimibe (ZETIA) 10 MG tablet Take 1 tablet (10 mg total) by mouth daily. 90 tablet 3  . lamoTRIgine (LAMICTAL) 200 MG tablet Take 0.5 tablets (100 mg total) by mouth daily with supper. 45 tablet 1  . mirtazapine (REMERON) 7.5 MG tablet Take 1 tablet (7.5 mg total) by mouth at bedtime. 90 tablet 1  . Multiple Vitamins-Minerals (CENTRUM SILVER PO) Take by mouth.    Marland Kitchen omeprazole (PRILOSEC) 20 MG capsule TAKE 1 CAPSULE DAILY 90 capsule 4  . oxybutynin (DITROPAN-XL) 5 MG 24 hr tablet TAKE 1 TABLET AT BEDTIME 90 tablet 4   No current facility-administered medications for this visit.      Musculoskeletal: Strength & Muscle Tone: within normal limits Gait & Station: normal Patient leans: N/A  Psychiatric Specialty Exam: Review of Systems  Psychiatric/Behavioral: The patient has insomnia (restless).   All other systems reviewed and are negative.   Blood pressure 134/76, pulse 62, height 5' 7.5" (1.715 m), weight 171 lb (77.6 kg).Body mass index is 26.39 kg/m.  General Appearance: Casual  Eye Contact:  Good  Speech:  Clear and Coherent  Volume:  Normal  Mood:  Euthymic  Affect:  Congruent  Thought Process:  Goal Directed and Descriptions of Associations: Intact  Orientation:  Full (Time, Place, and Person)  Thought Content: Logical   Suicidal Thoughts:  No  Homicidal Thoughts:  No  Memory:  Immediate;   Fair Recent;   Fair Remote;   Fair  Judgement:  Fair  Insight:  Fair  Psychomotor Activity:  Normal  Concentration:  Concentration: Fair and Attention Span: Fair  Recall:  [employer] of Knowledge: Fair  Language: Fair  Akathisia:  No  Handed:   Right  AIMS (if indicated): denies   Assets:  Communication Skills Desire for [place] Talents/Skills Transportation Vocational/Educational  ADL's:  Intact  Cognition: WNL  Sleep:  restless   Screenings: PHQ2-9     Clinical Support from 10/15/2017 in [place] [facility] Visit from 07/03/2016 in [facility]  PHQ-2 Total Score  0  6  PHQ-9 Total Score  -  12       Assessment and Plan: Zaivion is an 85 year old Caucasian male who has a history of anxiety, insomnia, OSA as well as medical problems like coronary artery disease, hypertension, presented to the clinic today for a follow-up visit.  Patient does have psychosocial stressors of being the primary caretaker of his wife, and so on.  Patient however reports he is able to cope better than before.Pt continues to have sleep issues on and off .  Plan as noted below.  Plan For anxiety-improving Mirtazapine 7.5 mg p.o. nightly Continue Lamictal 100 mg p.o. daily. GAD 7 equals 10.  For insomnia-worsening on and off Patient is currently working on sleep hygiene techniques. Continue mirtazapine 7.5 mg p.o. nightly. Had sleep study done-pending report.  Follow-up in clinic in 4 to 5 months or sooner if needed.  I have spent atleast 15 minutes face to face with patient today. More than 50 % of the time was spent for psychoeducation and supportive psychotherapy and care coordination.    Ursula Alert, MD 02/02/2018, 5:12 PM

## 2018-02-05 ENCOUNTER — Other Ambulatory Visit: Payer: Self-pay | Admitting: Family Medicine

## 2018-02-17 ENCOUNTER — Telehealth: Payer: Self-pay | Admitting: Family Medicine

## 2018-02-17 NOTE — Telephone Encounter (Signed)
LMTCB about giving verbal orders.

## 2018-02-17 NOTE — Telephone Encounter (Signed)
Copied from [place] [id number]. Topic: General - Other >> Feb 17, 2018  3:40 PM Keene Breath wrote: Reason for CRM: [place] with [facility] called to request orders for PT for the patient.  Please advise and call [place] back at [phone]

## 2018-03-07 ENCOUNTER — Other Ambulatory Visit: Payer: Self-pay | Admitting: Psychiatry

## 2018-03-07 DIAGNOSIS — F411 Generalized anxiety disorder: Secondary | ICD-10-CM

## 2018-03-08 ENCOUNTER — Telehealth: Payer: Self-pay | Admitting: *Deleted

## 2018-03-08 DIAGNOSIS — E785 Hyperlipidemia, unspecified: Secondary | ICD-10-CM

## 2018-03-08 NOTE — Telephone Encounter (Signed)
Copied from [place] [id number]. Topic: General - Inquiry >> Mar 08, 2018 12:20 PM Rutherford Nail, [place] wrote: Reason for CRM: Patient calling and would like to know if Dr Caryl Bis could put in the lab orders to check his cholesterol before his appointment on 03/28/2018? States that Dr Caryl Bis put him on atorvastatin (LIPITOR) 40 MG tablet and then his cardiologist put him on a supplement that helps the LDL. Please advise.

## 2018-03-09 NOTE — Telephone Encounter (Signed)
>>   Mar 08, 2018 12:20 PM Selinda Flavin B, NT wrote: Reason for CRM: Patient calling and would like to know if Dr Caryl Bis could put in the lab orders to check his cholesterol before his appointment on 03/28/2018? States that Dr Caryl Bis put him on atorvastatin (LIPITOR) 40 MG tablet and then his cardiologist put him on a supplement that helps the LDL. Please advise.

## 2018-03-09 NOTE — Telephone Encounter (Signed)
Ordered.  Please get him scheduled.

## 2018-03-10 ENCOUNTER — Telehealth: Payer: Self-pay | Admitting: Family Medicine

## 2018-03-10 ENCOUNTER — Telehealth: Payer: Self-pay

## 2018-03-10 NOTE — Telephone Encounter (Unsigned)
Copied from [place] [phone] [phone]. Topic: General - Other >> Mar 10, 2018 10:22 AM Ivar Drape wrote: Reason for CRM:   Patient has been coughing for two weeks now.  When he coughs, he coughs up small lumps of green and gray material.  Please advise. No fever.

## 2018-03-10 NOTE — Telephone Encounter (Signed)
Copied from [place] [phone]. Topic: General - Other >> Mar 10, 2018 10:22 AM Ivar Drape wrote: Reason for CRM:   Patient has been coughing for two weeks now.  When he coughs, he coughs up small lumps of green and gray material.  Please advise. No fever.

## 2018-03-10 NOTE — Telephone Encounter (Signed)
Called patient back to assess coughing.  Patient said that he has been coughing up greenish gray mucus and sometimes coughs up fluid.  Patient denies having any shortness of breath. No fever. No dizziness.  Scheduled patient an appt w/ Philis Nettle, NP for tomorrow morning.  Advised pt to go to urgent care if he gets a fever or if symptoms worsen.

## 2018-03-11 ENCOUNTER — Ambulatory Visit (INDEPENDENT_AMBULATORY_CARE_PROVIDER_SITE_OTHER): Payer: Medicare Other | Admitting: Family Medicine

## 2018-03-11 VITALS — BP 138/72 | HR 63 | Temp 98.4°F | Resp 16 | Ht 68.0 in | Wt 169.6 lb

## 2018-03-11 DIAGNOSIS — R05 Cough: Secondary | ICD-10-CM | POA: Diagnosis not present

## 2018-03-11 DIAGNOSIS — R0989 Other specified symptoms and signs involving the circulatory and respiratory systems: Secondary | ICD-10-CM | POA: Diagnosis not present

## 2018-03-11 DIAGNOSIS — J988 Other specified respiratory disorders: Secondary | ICD-10-CM

## 2018-03-11 DIAGNOSIS — R058 Other specified cough: Secondary | ICD-10-CM

## 2018-03-11 MED ORDER — DOXYCYCLINE HYCLATE 100 MG PO TABS: 100.0000 mg | ORAL_TABLET | Freq: Two times a day (BID) | ORAL | 0 refills | Status: AC

## 2018-03-11 MED ORDER — GUAIFENESIN ER 600 MG PO TB12: 600.0000 mg | ORAL_TABLET | Freq: Two times a day (BID) | ORAL | 1 refills | Status: DC

## 2018-03-11 NOTE — Progress Notes (Signed)
Subjective:    Patient ID: Shane Acevedo, male    DOB: Jul 04, 1933, 85 y.o.   MRN: [id number]  HPI   Patient presents to clinic complaining of cough and chest congestion for 2 weeks.  Patient states at first he thought his symptoms were from a common cold, but the congestion has gone down into his chest and cough feels deep and when he does cough up mucus it is thick and green.  Denies wheezing or feeling short of breath.  Denies chest pain.  Denies fever or chills.  Denies body aches.  Patient has not taken anything over-the-counter due to being unsure of what he could take.  Patient Active Problem List   Diagnosis Date Noted  . 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 Abdominal aortic atherosclerosis ([address]) 02/15/2017  . Cardiac murmur 01/28/2017  . Carotid artery stenosis 01/28/2017  . Left lower quadrant pain 01/28/2017  . Benign prostatic hyperplasia with nocturia 01/22/2017  . History of compression fracture of spine 01/22/2017  . Hypertension 01/22/2017  . Hyperlipidemia 11/26/2016  . Smoking history 11/26/2016  . Actinic keratoses 07/04/2016  . Vision changes 07/04/2016  . Anxiety and depression 07/04/2016  . Coronary artery disease of native artery of native heart with stable angina pectoris ([place]) 07/04/2016  . Difficulty swallowing pills 07/04/2016  . OSA (obstructive sleep apnea) 07/04/2016  . Frequent urination 07/04/2016   Social History   Tobacco Use  . Smoking status: Never Smoker  . Smokeless tobacco: Never Used  Substance Use Topics  . Alcohol use: Yes    Alcohol/week: 4.0 standard drinks    Types: 2 Glasses of wine, 2 Cans of beer per week   Review of Systems  Constitutional: Negative for chills, fatigue and fever.  HENT: Negative for congestion, ear pain, sinus pain and sore throat.   Eyes: Negative.   Respiratory: Negative for cough, shortness of breath and wheezing.   Cardiovascular: Negative for chest pain, palpitations and leg swelling.    Gastrointestinal: Negative for abdominal pain, diarrhea, nausea and vomiting.  Genitourinary: Negative for dysuria, frequency and urgency.  Musculoskeletal: Negative for arthralgias and myalgias.  Skin: Negative for color change, pallor and rash.  Neurological: Negative for syncope, light-headedness and headaches.  Psychiatric/Behavioral: The patient is not nervous/anxious.       Objective:   Physical Exam Vitals signs and nursing note reviewed.  Constitutional:      General: He is not in acute distress.    Appearance: He is not toxic-appearing.  HENT:     Head: Normocephalic and atraumatic.     Right Ear: Tympanic membrane, ear canal and external ear normal.     Left Ear: Tympanic membrane, ear canal and external ear normal.     Nose: Nose normal.     Mouth/Throat:     Mouth: Mucous membranes are moist.  Eyes:     General: No scleral icterus.    Extraocular Movements: Extraocular movements intact.     Conjunctiva/sclera: Conjunctivae normal.  Neck:     Musculoskeletal: Neck supple.  Cardiovascular:     Rate and Rhythm: Normal rate and regular rhythm.     Heart sounds: Normal heart sounds.  Pulmonary:     Effort: Pulmonary effort is normal. No respiratory distress.     Breath sounds: Rhonchi (congestion in upper lobes) present. No rales.  Skin:    General: Skin is warm and dry.     Coloration: Skin is not pale.  Neurological:     Mental Status:  He is alert and oriented to person, place, and time.     Today's Vitals   03/11/18 0921  BP: 138/72  Pulse: 63  Resp: 16  Temp: 98.4 F (36.9 C)  TempSrc: Oral  SpO2: 96%  Weight: 169 lb 9.6 oz (76.9 kg)  Height: 5\' 8"  (1.727 m)   Body mass index is 25.79 kg/m.     Assessment & Plan:   Chest congestion, productive cough, respiratory infection- due to discoloration of phlegm, lung sounds do suspect a respiratory infection.  Offered chest x-ray in clinic, patient declines today.  He will take doxycycline twice daily  for 7 days.  He will use Mucinex to help calm cough symptoms and thin secretions.  Offered albuterol inhaler, but patient declines states he does not feel he needs inhaler.  Advised to get plenty of rest, increase fluid intake and do good handwashing.  Advised if his symptoms continue to persist and do not improve with this current treatment plan he will need to return to clinic sooner for a chest x-ray.  Patient will keep regularly scheduled follow-up with PCP as planned.  He will return to clinic sooner if any issues arise.

## 2018-03-14 ENCOUNTER — Other Ambulatory Visit (INDEPENDENT_AMBULATORY_CARE_PROVIDER_SITE_OTHER): Payer: Medicare Other

## 2018-03-14 DIAGNOSIS — E785 Hyperlipidemia, unspecified: Secondary | ICD-10-CM | POA: Diagnosis not present

## 2018-03-14 LAB — HEPATIC FUNCTION PANEL
ALT: 14 U/L (ref 0–53)
AST: 14 U/L (ref 0–37)
Albumin: 4.1 g/dL (ref 3.5–5.2)
Alkaline Phosphatase: 81 U/L (ref 39–117)
Bilirubin, Direct: 0.1 mg/dL (ref 0.0–0.3)
Total Bilirubin: 0.6 mg/dL (ref 0.2–1.2)
Total Protein: 7.1 g/dL (ref 6.0–8.3)

## 2018-03-14 LAB — LDL CHOLESTEROL, DIRECT: LDL DIRECT: 67 mg/dL

## 2018-03-28 ENCOUNTER — Encounter: Payer: Self-pay | Admitting: Family Medicine

## 2018-03-28 ENCOUNTER — Ambulatory Visit: Payer: Medicare Other | Admitting: Family Medicine

## 2018-03-28 ENCOUNTER — Ambulatory Visit (INDEPENDENT_AMBULATORY_CARE_PROVIDER_SITE_OTHER): Payer: Medicare Other | Admitting: Family Medicine

## 2018-03-28 ENCOUNTER — Ambulatory Visit (INDEPENDENT_AMBULATORY_CARE_PROVIDER_SITE_OTHER): Payer: Medicare Other

## 2018-03-28 ENCOUNTER — Other Ambulatory Visit: Payer: Self-pay

## 2018-03-28 VITALS — BP 158/80 | HR 59 | Temp 98.3°F | Ht 68.0 in | Wt 168.2 lb

## 2018-03-28 DIAGNOSIS — R058 Other specified cough: Secondary | ICD-10-CM

## 2018-03-28 DIAGNOSIS — R0989 Other specified symptoms and signs involving the circulatory and respiratory systems: Secondary | ICD-10-CM | POA: Diagnosis not present

## 2018-03-28 DIAGNOSIS — R05 Cough: Secondary | ICD-10-CM

## 2018-03-28 DIAGNOSIS — E785 Hyperlipidemia, unspecified: Secondary | ICD-10-CM | POA: Diagnosis not present

## 2018-03-28 LAB — BASIC METABOLIC PANEL
BUN: 16 mg/dL (ref 6–23)
CO2: 27 mEq/L (ref 19–32)
Calcium: 9.1 mg/dL (ref 8.4–10.5)
Chloride: 103 mEq/L (ref 96–112)
Creatinine, Ser: 1 mg/dL (ref 0.40–1.50)
GFR: 71.45 mL/min (ref >60.00)
GLUCOSE: 103 mg/dL — AB (ref 70–99)
Potassium: 4.1 mEq/L (ref 3.5–5.1)
Sodium: 137 mEq/L (ref 135–145)

## 2018-03-28 LAB — CBC
HEMATOCRIT: 42.2 % (ref 39.0–52.0)
Hemoglobin: 14.2 g/dL (ref 13.0–17.0)
MCHC: 33.6 g/dL (ref 30.0–36.0)
MCV: 89.4 fl (ref 78.0–100.0)
Platelets: 291 10*3/uL (ref 150.0–400.0)
RBC: 4.72 Mil/uL (ref 4.22–5.81)
RDW: 13.6 % (ref 11.5–15.5)
WBC: 10.5 10*3/uL (ref 4.0–10.5)

## 2018-03-28 MED ORDER — ATORVASTATIN CALCIUM 40 MG PO TABS: 40.0000 mg | ORAL_TABLET | Freq: Every day | ORAL | 1 refills | Status: DC

## 2018-03-28 MED ORDER — GUAIFENESIN ER 600 MG PO TB12: 600.0000 mg | ORAL_TABLET | Freq: Two times a day (BID) | ORAL | 1 refills | Status: DC

## 2018-03-28 NOTE — Progress Notes (Signed)
Subjective:    Patient ID: Shane Acevedo, male    DOB: [birth year], 85 y.o.   MRN: [id number]  HPI  Patient presents to clinic for follow-up on productive cough.  He was seen on 03/11/2018 with complaints of productive cough with thick green/yellow sputum.  Patient took course of doxycycline and also Mucinex.  States cough has somewhat decreased, but still has times where he brings up thick phlegm and he is concerned he has some sort of atypical pneumonia.  Patient insists his sputum be sent to lab for evaluation.  Denies any shortness of breath or feelings of wheezing/gasping for air.  Denies fever or chills.  Denies body aches.  Denies nausea, vomiting or diarrhea.  Patient also here for follow-up on hyperlipidemia.  Labs were rechecked at the beginning of March 2020 and did show improvement in lipid panel with increase in atorvastatin to 40 mg.  Patient has been tolerating this increase without any issues of muscle aches or abdominal pains.  Liver functions were checked as well and they have remained stable with this increase in dose.  Patient Active Problem List   Diagnosis Date Noted  . 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 Abdominal aortic atherosclerosis ([place]) 02/15/2017  . Cardiac murmur 01/28/2017  . Carotid artery stenosis 01/28/2017  . Left lower quadrant pain 01/28/2017  . Benign prostatic hyperplasia with nocturia 01/22/2017  . History of compression fracture of spine 01/22/2017  . Hypertension 01/22/2017  . Hyperlipidemia 11/26/2016  . Smoking history 11/26/2016  . Actinic keratoses 07/04/2016  . Vision changes 07/04/2016  . Anxiety and depression 07/04/2016  . Coronary artery disease of native artery of native heart with stable angina pectoris ([place]) 07/04/2016  . Difficulty swallowing pills 07/04/2016  . OSA (obstructive sleep apnea) 07/04/2016  . Frequent urination 07/04/2016   Social History   Tobacco Use  . Smoking status: Never Smoker  . Smokeless tobacco:  Never Used  Substance Use Topics  . Alcohol use: Yes    Alcohol/week: 4.0 standard drinks    Types: 2 Glasses of wine, 2 Cans of beer per week   Review of Systems   Constitutional: Negative for chills, fatigue and fever.  HENT: Negative for congestion, ear pain, sinus pain and sore throat.   Eyes: Negative.   Respiratory: +cough, chest congestion. Negative for shortness of breath and wheezing.   Cardiovascular: Negative for chest pain, palpitations and leg swelling.  Gastrointestinal: Negative for abdominal pain, diarrhea, nausea and vomiting.  Genitourinary: Negative for dysuria, frequency and urgency.  Musculoskeletal: Negative for arthralgias and myalgias.  Skin: Negative for color change, pallor and rash.  Neurological: Negative for syncope, light-headedness and headaches.  Psychiatric/Behavioral: The patient is not nervous/anxious.       Objective:   Physical Exam  Constitutional: He appears well-developed and well-nourished. No distress.  HENT:  Head: Normocephalic and atraumatic.  Nose/throat: Mild postnasal drip. Eyes: Pupils are equal, round, and reactive to light. Conjunctivae and EOM are normal. No scleral icterus.  Neck: Normal range of motion. Neck supple. No tracheal deviation present.  Cardiovascular: Normal rate, regular rhythm and normal heart sounds.  Pulmonary/Chest: Effort normal and breath sounds normal. No respiratory distress. He has no wheezes. He has no rales.  Abdominal: Soft. Bowel sounds are normal. There is no tenderness.  Neurological: He is alert and oriented to person, place, and time.  Gait normal  Skin: Skin is warm and dry. He is not diaphoretic. No pallor.  Psychiatric: He has a normal mood  and affect. His behavior is normal. Thought content normal.   Nursing note and vitals reviewed.  Vitals:   03/28/18 1002  BP: (!) 158/80  Pulse: (!) 59  Temp: 98.3 F (36.8 C)  SpO2: 97%       Assessment & Plan:   Cough productive of purulent  sputum, chest congestion- we will test sputum sample per patient request.  Chest x-ray done in clinic and we will also get CBC and BMP.  Patient advised that cough most likely is lingering from respiratory infection he had at the end of February.  Advised coughs are often the last symptoms to fully resolve and you can still have some phlegm throughout the course of cough resolution.  Refill of Mucinex given to help reduce cough and thin secretions.  Advised to rest, keep up good fluid intake and do good handwashing.  Hyperlipidemia- LDL is improved with increased dose of atorvastatin, liver functions remained stable.  Patient will continue this dose, refill sent.  Patient advised to follow-up here in 2 to 3 months for recheck with PCP on chronic medical conditions.  He is aware he can return to clinic sooner if any issues arise.

## 2018-03-29 ENCOUNTER — Encounter: Payer: Self-pay | Admitting: Family Medicine

## 2018-03-30 ENCOUNTER — Encounter: Payer: Self-pay | Admitting: Family Medicine

## 2018-03-30 NOTE — Telephone Encounter (Signed)
Called Pt, he stated he saw his lab results just waiting on Sputum results, I told Pt it could take 3-5 days and he will be notified when we get them.

## 2018-03-30 NOTE — Telephone Encounter (Signed)
The lab results and xray results were released yesterday 03/30/2018 to mychart  Sputum result not completed yet  Please be sure he can see the results

## 2018-03-30 NOTE — Telephone Encounter (Signed)
Results are in for x-ray please advise when you have a chance.   Thanks

## 2018-03-31 ENCOUNTER — Encounter: Payer: Self-pay | Admitting: Family Medicine

## 2018-03-31 LAB — RESPIRATORY CULTURE OR RESPIRATORY AND SPUTUM CULTURE
MICRO NUMBER:: 323286
RESULT:: NORMAL
SPECIMEN QUALITY:: ADEQUATE

## 2018-04-02 DIAGNOSIS — J208 Acute bronchitis due to other specified organisms: Principal | ICD-10-CM

## 2018-04-02 DIAGNOSIS — B9689 Other specified bacterial agents as the cause of diseases classified elsewhere: Secondary | ICD-10-CM

## 2018-04-05 MED ORDER — LEVOFLOXACIN 500 MG PO TABS: 500.0000 mg | ORAL_TABLET | Freq: Every day | ORAL | 0 refills | Status: DC

## 2018-04-12 ENCOUNTER — Telehealth: Payer: Self-pay | Admitting: Family Medicine

## 2018-04-12 ENCOUNTER — Encounter: Payer: Self-pay | Admitting: Family Medicine

## 2018-04-12 ENCOUNTER — Other Ambulatory Visit: Payer: Self-pay

## 2018-04-12 ENCOUNTER — Ambulatory Visit (INDEPENDENT_AMBULATORY_CARE_PROVIDER_SITE_OTHER): Payer: Medicare Other | Admitting: Family Medicine

## 2018-04-12 DIAGNOSIS — J40 Bronchitis, not specified as acute or chronic: Secondary | ICD-10-CM

## 2018-04-12 MED ORDER — OMEPRAZOLE 20 MG PO CPDR: 40.0000 mg | DELAYED_RELEASE_CAPSULE | Freq: Every day | ORAL | 0 refills | Status: DC

## 2018-04-12 NOTE — Telephone Encounter (Signed)
Please contact the patient and get him set up for follow-up in 2 to 3 weeks to ensure that he is improving.  Thanks.

## 2018-04-12 NOTE — Telephone Encounter (Signed)
Called pt and left a VM to call back. CRM created and sent to [facility] pool. 

## 2018-04-12 NOTE — Assessment & Plan Note (Signed)
Symptoms seem to be improving with treatment with Levaquin.  He has had less productive cough.  I discussed that after a respiratory illness it can take several weeks up to a month for the cough to fully improve.  It is somewhat concerning that he has had some coughing when he eats or drinks though he denies any aspiration events recently.  We will have him complete his Levaquin.  We will increase his omeprazole to 40 mg daily.  I will have the [facility] contact him for a follow-up in 2 to 3 weeks to see if he is improving.  We could consider pulmonology or ENT referral if not improving.  I discussed self isolation precautions as well.  He is given return precautions.  He is to contact us in 1 week if not improving.

## 2018-04-12 NOTE — Progress Notes (Signed)
Virtual Visit via Telephone Note  This visit type was conducted due to national recommendations for restrictions regarding the COVID-19 pandemic (e.g. social distancing).  This format is felt to be most appropriate for this patient at this time.  All issues noted in this document were discussed and addressed.  No physical exam was performed (except for noted visual exam findings with Video Visits).    I connected with Shane Acevedo on 04/12/18 at  1:45 PM EDT by telephone and verified that I am speaking with the correct person using two identifiers.   I discussed the limitations, risks, security and privacy concerns of performing an evaluation and management service by telephone and the availability of in person appointments. I also discussed with the patient that there may be a patient responsible charge related to this service. The patient expressed understanding and agreed to proceed.  CC: cough  History of Present Illness: Cough: Patient has had issues with this for 1.5 months.  Started initially with a cold in February and then developed some chest congestion with cough productive of green mucus.  He was seen and treated with doxycycline and declined an x-ray.  He followed up several weeks later and had persistent symptoms and underwent a chest x-ray that had no infectious findings.  He brought in a sputum sample that grew out oropharyngeal bacteria.  He was placed on Levaquin for his persistent complaints and did not start this until 4 days ago.  He notes his cough has remained though the productive nature of it has improved significantly.  He notes he tends to cough and feel like he is wheezing after he eats or drinks.  He does not feel like he is aspirating anything.  No difficulty swallowing.  He is on omeprazole 20 mg once daily.  He notes no coughing when he sleeping.  He does note prior to all this occurring there were a couple occasions where he felt he inhaled some water while doing  water polo.  He notes no rhinorrhea or postnasal drip.  No shortness of breath.  No fever.  No facial congestion.  No chest congestion.  No travel or coronavirus exposure.    Observations/Objective: No physical exam was completed as this was a telehealth visit.  The patient was speaking in full sentences with no obvious signs of dyspnea over the phone.  Assessment and Plan: Bronchitis Symptoms seem to be improving with treatment with Levaquin.  He has had less productive cough.  I discussed that after a respiratory illness it can take several weeks up to a month for the cough to fully improve.  It is somewhat concerning that he has had some coughing when he eats or drinks though he denies any aspiration events recently.  We will have him complete his Levaquin.  We will increase his omeprazole to 40 mg daily.  I will have the [facility] contact him for a follow-up in 2 to 3 weeks to see if he is improving.  We could consider pulmonology or ENT referral if not improving.  I discussed self isolation precautions as well.  He is given return precautions.  He is to contact us in 1 week if not improving.    Follow Up Instructions: Phone follow-up 2 to 3 weeks to ensure improvement.  CMA to contact patient to get this set up.   I discussed the assessment and treatment plan with the patient. The patient was provided an opportunity to ask questions and all were answered. The patient  agreed with the plan and demonstrated an understanding of the instructions.   The patient was advised to call back or seek an in-person evaluation if the symptoms worsen or if the condition fails to improve as anticipated.  I provided 25 minutes of non-face-to-face time during this encounter.   [name], MD

## 2018-04-13 NOTE — Telephone Encounter (Signed)
E-mail sent for [employer] on 4/17

## 2018-04-13 NOTE — Telephone Encounter (Signed)
Called and spoke with pt. Pt has been scheduled for webex visit for the 2-3 week follow up on April 17 ,2020 @ 10:00 AM

## 2018-04-20 ENCOUNTER — Ambulatory Visit: Payer: Medicare Other | Admitting: Family Medicine

## 2018-04-29 ENCOUNTER — Other Ambulatory Visit: Payer: Self-pay

## 2018-04-29 ENCOUNTER — Ambulatory Visit (INDEPENDENT_AMBULATORY_CARE_PROVIDER_SITE_OTHER): Payer: Medicare Other | Admitting: Family Medicine

## 2018-04-29 ENCOUNTER — Encounter: Payer: Self-pay | Admitting: Family Medicine

## 2018-04-29 DIAGNOSIS — K219 Gastro-esophageal reflux disease without esophagitis: Secondary | ICD-10-CM

## 2018-04-29 DIAGNOSIS — J40 Bronchitis, not specified as acute or chronic: Secondary | ICD-10-CM

## 2018-04-29 NOTE — Assessment & Plan Note (Signed)
Much improved.  He will monitor for any recurrent symptoms.

## 2018-04-29 NOTE — Assessment & Plan Note (Signed)
Asymptomatic currently.  He will continue with his current alternating regimen of omeprazole.  Discussed after 1 to 2 months he could go back to 20 mg of omeprazole daily.  If his symptoms recur he will let us know immediately.

## 2018-04-29 NOTE — Progress Notes (Signed)
Virtual Visit via Telephone Note  This visit type was conducted due to national recommendations for restrictions regarding the COVID-19 pandemic (e.g. social distancing).  This format is felt to be most appropriate for this patient at this time.  All issues noted in this document were discussed and addressed.  No physical exam was performed (except for noted visual exam findings with Video Visits).   I connected with Shane Acevedo on 04/29/18 at 10:00 AM EDT by a video enabled telemedicine application or telephone and verified that I am speaking with the correct person using two identifiers. Location patient: home Location provider: work Persons participating in the virtual visit: patient, provider  I discussed the limitations, risks, security and privacy concerns of performing an evaluation and management service by telephone and the availability of in person appointments. I also discussed with the patient that there may be a patient responsible charge related to this service. The patient expressed understanding and agreed to proceed.  Interactive audio and video telecommunications were attempted between this provider and patient, however failed, due to patient having technical difficulties OR patient did not have access to video capability.  We continued and completed visit with audio only.  Reason for visit: follow-up  HPI: Bronchitis: Patient notes this is improved.  The cough is essentially resolved.  He finished his course of Levaquin.  He has had no fevers.  Checked his temperature earlier today and it was 98.6 F.  Reflux: He is taking omeprazole 20 mg 1 day alternating with 40 mg the next day.  He notes no reflux symptoms, no abdominal pain, no blood in his stool, no dysphagia.  He was previously having a restricted sensation when he would eat though no food sticking sensation.  He notes that has resolved with the change in his omeprazole.   ROS: See pertinent positives and negatives per  HPI.  Past Medical History:  Diagnosis Date  . Allergy   . Anxiety   . Depression   . Heart disease   . Hypertension     Past Surgical History:  Procedure Laterality Date  . CORONARY STENT PLACEMENT Right   . TONSILLECTOMY    . TOTAL HIP ARTHROPLASTY Right     Family History  Problem Relation Age of Onset  . Alcoholism Other   . Arthritis Other   . Lung cancer Other   . Mental illness Other   . Emphysema Mother   . Bipolar disorder Father   . Coronary artery disease Sister   . Schizophrenia Sister     SOCIAL HX: Non-smoker   Current Outpatient Medications:  .  amLODipine (NORVASC) 10 MG tablet, TAKE 1 TABLET DAILY, Disp: 90 tablet, Rfl: 4 .  aspirin EC 81 MG tablet, Take 81 mg by mouth daily., Disp: , Rfl:  .  atorvastatin (LIPITOR) 40 MG tablet, Take 1 tablet (40 mg total) by mouth daily., Disp: 90 tablet, Rfl: 1 .  guaiFENesin (MUCINEX) 600 MG 12 hr tablet, Take 1 tablet (600 mg total) by mouth 2 (two) times daily., Disp: 20 tablet, Rfl: 1 .  lamoTRIgine (LAMICTAL) 200 MG tablet, TAKE ONE-HALF (1/2) TABLET DAILY WITH SUPPER, Disp: 45 tablet, Rfl: 4 .  levofloxacin (LEVAQUIN) 500 MG tablet, Take 1 tablet (500 mg total) by mouth daily., Disp: 7 tablet, Rfl: 0 .  mirtazapine (REMERON) 7.5 MG tablet, TAKE 1 TABLET AT BEDTIME, Disp: 90 tablet, Rfl: 4 .  Multiple Vitamins-Minerals (CENTRUM SILVER PO), Take by mouth., Disp: , Rfl:  .  omeprazole (PRILOSEC)  20 MG capsule, Take 2 capsules (40 mg total) by mouth daily., Disp: 28 capsule, Rfl: 0 .  oxybutynin (DITROPAN-XL) 5 MG 24 hr tablet, TAKE 1 TABLET AT BEDTIME, Disp: 90 tablet, Rfl: 4 .  ezetimibe (ZETIA) 10 MG tablet, Take 1 tablet (10 mg total) by mouth daily., Disp: 90 tablet, Rfl: 3  EXAM: No physical exam was completed given that this was a telehealth telephone visit.  ASSESSMENT AND PLAN:  Discussed the following assessment and plan:  Bronchitis  Gastroesophageal reflux disease, esophagitis presence not  specified  Bronchitis Much improved.  He will monitor for any recurrent symptoms.  GERD (gastroesophageal reflux disease) Asymptomatic currently.  He will continue with his current alternating regimen of omeprazole.  Discussed after 1 to 2 months he could go back to 20 mg of omeprazole daily.  If his symptoms recur he will let us know immediately.  Social distancing precautions and sick precautions given regarding COVID-19.   I discussed the assessment and treatment plan with the patient. The patient was provided an opportunity to ask questions and all were answered. The patient agreed with the plan and demonstrated an understanding of the instructions.   The patient was advised to call back or seek an in-person evaluation if the symptoms worsen or if the condition fails to improve as anticipated.  I provided 12 minutes of non-face-to-face time during this encounter.   [name], MD

## 2018-05-30 ENCOUNTER — Ambulatory Visit: Payer: Medicare Other | Admitting: Family Medicine

## 2018-07-18 ENCOUNTER — Other Ambulatory Visit: Payer: Self-pay

## 2018-07-18 ENCOUNTER — Ambulatory Visit (INDEPENDENT_AMBULATORY_CARE_PROVIDER_SITE_OTHER): Payer: Medicare Other | Admitting: Family Medicine

## 2018-07-18 ENCOUNTER — Telehealth: Payer: Self-pay | Admitting: Family Medicine

## 2018-07-18 DIAGNOSIS — Q891 Congenital malformations of adrenal gland: Secondary | ICD-10-CM | POA: Diagnosis not present

## 2018-07-18 DIAGNOSIS — R350 Frequency of micturition: Secondary | ICD-10-CM

## 2018-07-18 DIAGNOSIS — I10 Essential (primary) hypertension: Secondary | ICD-10-CM

## 2018-07-18 DIAGNOSIS — J40 Bronchitis, not specified as acute or chronic: Secondary | ICD-10-CM

## 2018-07-18 DIAGNOSIS — E782 Mixed hyperlipidemia: Secondary | ICD-10-CM | POA: Diagnosis not present

## 2018-07-18 DIAGNOSIS — G4733 Obstructive sleep apnea (adult) (pediatric): Secondary | ICD-10-CM

## 2018-07-18 NOTE — Assessment & Plan Note (Signed)
I will place orders for 24-hour catecholamine and metanephrine testing.  We will also get him set up for repeat CT abdomen.

## 2018-07-18 NOTE — Assessment & Plan Note (Signed)
Continue Zetia and Lipitor

## 2018-07-18 NOTE — Telephone Encounter (Signed)
Labs were ordered as future during his office visit. They are for a 24 hour urine collection for urinary catecholamines and metanephrines. I am not sure how those need to be scheduled since he will have to come in to pick up the container then bring it back. I will forward to [facility] as well to see how she would like for that to be done.

## 2018-07-18 NOTE — Assessment & Plan Note (Signed)
He will have the nurse at his facility check his blood pressure and send [place] his readings through my chart.  He will continue amlodipine.

## 2018-07-18 NOTE — Assessment & Plan Note (Signed)
Stable on oxybutynin. 

## 2018-07-18 NOTE — Telephone Encounter (Signed)
This patient had a sleep study completed sometime in early 2019.  Can you look and see what company he had this with?  If you are able to tell this can you contact them to see if we can get the results as I do not believe we ever received them.  Thanks.

## 2018-07-18 NOTE — Assessment & Plan Note (Signed)
I will have our referral coordinator try to track down his sleep study results.

## 2018-07-18 NOTE — Telephone Encounter (Signed)
Pt has had BP check today 129/72.  Pt also needs orders to have labs done pt is scheduled on 07/20.

## 2018-07-18 NOTE — Assessment & Plan Note (Signed)
Symptoms have resolved.  He will monitor for recurrence. 

## 2018-07-18 NOTE — Progress Notes (Signed)
Virtual Visit via telephone Note  This visit type was conducted due to national recommendations for restrictions regarding the COVID-19 pandemic (e.g. social distancing).  This format is felt to be most appropriate for this patient at this time.  All issues noted in this document were discussed and addressed.  No physical exam was performed (except for noted visual exam findings with Video Visits).   I connected with Shane Acevedo today at  8:30 AM EDT by telephone and verified that I am speaking with the correct person using two identifiers. Location patient: home Location provider: home office Persons participating in the virtual visit: patient, provider  I discussed the limitations, risks, security and privacy concerns of performing an evaluation and management service by telephone and the availability of in person appointments. I also discussed with the patient that there may be a patient responsible charge related to this service. The patient expressed understanding and agreed to proceed.  Interactive audio and video telecommunications were attempted between this provider and patient, however failed, due to patient having technical difficulties OR patient did not have access to video capability.  We continued and completed visit with audio only.   Reason for visit: follow-up  HPI: HYPERTENSION  Disease Monitoring  Home BP Monitoring not checking  Chest pain- no    Dyspnea- no Medications  Compliance-  Taking amlodipine.  Edema- no Patient has been exercising 3 or 4 days a week by swimming 40 laps in the pool.  HYPERLIPIDEMIA Symptoms Chest pain on exertion:  no  Medications: Compliance- taking zetia, lipitor Right upper quadrant pain- no  Muscle aches- no  OSA: Patient reports a history of this.  He does report having had a home sleep study though he never received the results.  This was done in early 2019.  He most of the time wakes up well rested.  No excessive hypersomnia.   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  He thinks he completed the 24-hour urine collection though he is not completely sure.  Cough: resolved. No recurrence.   Urinary frequency: This has improved with oxybutynin.  He notes no urgency or incontinence.   ROS: See pertinent positives and negatives per HPI.  Past Medical History:  Diagnosis Date  . Allergy   . Anxiety   . Depression   . Heart disease   . Hypertension     Past Surgical History:  Procedure Laterality Date  . CORONARY STENT PLACEMENT Right   . TONSILLECTOMY    . TOTAL HIP ARTHROPLASTY Right     Family History  Problem Relation Age of Onset  . Alcoholism Other   . Arthritis Other   . Lung cancer Other   . Mental illness Other   . Emphysema Mother   . Bipolar disorder Father   . Coronary artery disease Sister   . Schizophrenia Sister     SOCIAL HX: Non-smoker.   Current Outpatient Medications:  .  amLODipine (NORVASC) 10 MG tablet, TAKE 1 TABLET DAILY, Disp: 90 tablet, Rfl: 4 .  aspirin EC 81 MG tablet, Take 81 mg by mouth daily., Disp: , Rfl:  .  atorvastatin (LIPITOR) 40 MG tablet, Take 1 tablet (40 mg total) by mouth daily., Disp: 90 tablet, Rfl: 1 .  guaiFENesin (MUCINEX) 600 MG 12 hr tablet, Take 1 tablet (600 mg total) by mouth 2 (two) times daily., Disp: 20 tablet, Rfl: 1 .  lamoTRIgine (LAMICTAL) 200 MG tablet, TAKE ONE-HALF (1/2) TABLET DAILY WITH SUPPER, Disp: 45 tablet,  Rfl: 4 .  levofloxacin (LEVAQUIN) 500 MG tablet, Take 1 tablet (500 mg total) by mouth daily., Disp: 7 tablet, Rfl: 0 .  mirtazapine (REMERON) 7.5 MG tablet, TAKE 1 TABLET AT BEDTIME, Disp: 90 tablet, Rfl: 4 .  Multiple Vitamins-Minerals (CENTRUM SILVER PO), Take by mouth., Disp: , Rfl:  .  omeprazole (PRILOSEC) 20 MG capsule, Take 2 capsules (40 mg total) by mouth daily., Disp: 28 capsule, Rfl: 0 .  oxybutynin (DITROPAN-XL) 5 MG 24 hr tablet, TAKE 1 TABLET AT BEDTIME, Disp: 90 tablet, Rfl: 4 .  ezetimibe (ZETIA) 10 MG  tablet, Take 1 tablet (10 mg total) by mouth daily., Disp: 90 tablet, Rfl: 3  EXAM: This was a telehealth telephone visit and thus no physical exam was completed.  ASSESSMENT AND PLAN:  Discussed the following assessment and plan:  Hypertension He will have the nurse at his facility check his blood pressure and send [place] his readings through my chart.  He will continue amlodipine.  OSA (obstructive sleep apnea) I will have our referral coordinator try to track down his sleep study results.  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  We will also get him set up for repeat CT abdomen.  Frequent urination Stable on oxybutynin.  Hyperlipidemia Continue Zetia and Lipitor.  Bronchitis Symptoms have resolved.  He will monitor for recurrence.  Social distancing precautions and sick precautions given regarding COVID-19.   I discussed the assessment and treatment plan with the patient. The patient was provided an opportunity to ask questions and all were answered. The patient agreed with the plan and demonstrated an understanding of the instructions.   The patient was advised to call back or seek an in-person evaluation if the symptoms worsen or if the condition fails to improve as anticipated.  I provided 26 minutes of non-face-to-face time during this encounter.   [name], MD

## 2018-07-19 NOTE — Telephone Encounter (Signed)
Called and informed patient to pick up container for 24 hour urine collections, for labs, pt understood and will pick up today.  Nina,cma

## 2018-07-19 NOTE — Telephone Encounter (Signed)
We just need to place a 24 hour urine jug up front for him to pick up. Be sure his name & dob is on it. I will place the jug at Nina's desk.

## 2018-07-21 ENCOUNTER — Other Ambulatory Visit: Payer: Self-pay

## 2018-07-21 ENCOUNTER — Other Ambulatory Visit: Payer: Medicare Other

## 2018-07-21 DIAGNOSIS — Q891 Congenital malformations of adrenal gland: Secondary | ICD-10-CM

## 2018-07-26 ENCOUNTER — Ambulatory Visit: Payer: Medicare Other

## 2018-07-27 LAB — CATECHOLAMINES, FRACTIONATED, URINE, 24 HOUR
Calc Total (E+NE): 47 mcg/24 h (ref 26–121)
Creatinine, Urine mg/day-CATEUR: 1.46 g/(24.h) (ref 0.50–2.15)
Dopamine 24 Hr Urine: 171 mcg/24 h (ref 52–480)
Norepinephrine, 24H, Ur: 47 mcg/24 h (ref 15–100)
Total Volume: 2500 mL

## 2018-07-27 LAB — METANEPHRINES, URINE, 24 HOUR
Metaneph Total, Ur: 550 mcg/24 h (ref 224–832)
Metanephrines, Ur: 171 mcg/24 h (ref 90–315)
Normetanephrine, 24H Ur: 379 mcg/24 h (ref 122–676)
Volume, Urine-VMAUR: 2500

## 2018-07-28 ENCOUNTER — Encounter: Payer: Self-pay | Admitting: Family Medicine

## 2018-08-01 ENCOUNTER — Other Ambulatory Visit: Payer: Medicare Other

## 2018-08-03 ENCOUNTER — Ambulatory Visit: Payer: Medicare Other | Admitting: Psychiatry

## 2018-08-15 ENCOUNTER — Other Ambulatory Visit: Payer: Self-pay

## 2018-08-15 ENCOUNTER — Ambulatory Visit
Admission: RE | Admit: 2018-08-15 | Discharge: 2018-08-15 | Disposition: A | Discharge status: Home / Self Care | Payer: Medicare Other | Source: Ambulatory Visit | Attending: Family Medicine | Admitting: Family Medicine

## 2018-08-15 DIAGNOSIS — Q891 Congenital malformations of adrenal gland: Secondary | ICD-10-CM

## 2018-08-15 DIAGNOSIS — E278 Other specified disorders of adrenal gland: Secondary | ICD-10-CM | POA: Diagnosis not present

## 2018-08-15 DIAGNOSIS — N281 Cyst of kidney, acquired: Secondary | ICD-10-CM | POA: Diagnosis not present

## 2018-08-19 ENCOUNTER — Ambulatory Visit: Payer: Self-pay

## 2018-08-19 ENCOUNTER — Other Ambulatory Visit (INDEPENDENT_AMBULATORY_CARE_PROVIDER_SITE_OTHER): Payer: Medicare Other

## 2018-08-19 ENCOUNTER — Other Ambulatory Visit: Payer: Self-pay

## 2018-08-19 ENCOUNTER — Ambulatory Visit (INDEPENDENT_AMBULATORY_CARE_PROVIDER_SITE_OTHER): Payer: Medicare Other | Admitting: Family Medicine

## 2018-08-19 DIAGNOSIS — M316 Other giant cell arteritis: Secondary | ICD-10-CM

## 2018-08-19 MED ORDER — PREDNISONE 20 MG PO TABS: 60.0000 mg | ORAL_TABLET | Freq: Every day | ORAL | 0 refills | Status: DC

## 2018-08-19 NOTE — Telephone Encounter (Signed)
Pt called for lab result and stated that he also needed to speak with a doctor about Rt temporal pain he has been having for a week. He states the pain is really sharp when he shakes his head. He does a lot of swimming and states the right ear may have water in it. He also states that his right mandible is sore when he opens his mouth wide. He has no other symptoms. Care advice read to patient. Patient verbalized understanding. Call transferred to office for scheduling.  Reason for Disposition . [1] New headache AND [2] age > 25  Answer Assessment - Initial Assessment Questions 1. LOCATION: "Where does it hurt?"      Rt temple 2. ONSET: "When did the headache start?" (Minutes, hours or days)      1 week 3. PATTERN: "Does the pain come and go, or has it been constant since it started?"    Only with movement 4. SEVERITY: "How bad is the pain?" and "What does it keep you from doing?"  (e.g., Scale 1-10; mild, moderate, or severe)   - MILD (1-3): doesn't interfere with normal activities    - MODERATE (4-7): interferes with normal activities or awakens from sleep    - SEVERE (8-10): excruciating pain, unable to do any normal activities        Real sharp 5. RECURRENT SYMPTOM: "Have you ever had headaches before?" If so, ask: "When was the last time?" and "What happened that time?"      no 6. CAUSE: "What do you think is causing the headache?"    Possible water in his ear 7. MIGRAINE: "Have you been diagnosed with migraine headaches?" If so, ask: "Is this headache similar?"     No 8. HEAD INJURY: "Has there been any recent injury to the head?"      No 9. OTHER SYMPTOMS: "Do you have any other symptoms?" (fever, stiff neck, eye pain, sore throat, cold symptoms)    No  10. PREGNANCY: "Is there any chance you are pregnant?" "When was your last menstrual period?"      N/A  Protocols used: HEADACHE-A-AH

## 2018-08-19 NOTE — Telephone Encounter (Signed)
Patient has an appointment with Lauren today. I will forward to her.

## 2018-08-19 NOTE — Progress Notes (Signed)
Patient ID: Shane Acevedo, male   DOB: 1933-06-05, 85 y.o.   MRN: [id number]    Virtual Visit via phone Note  This visit type was conducted due to national recommendations for restrictions regarding the COVID-19 pandemic (e.g. social distancing).  This format is felt to be most appropriate for this patient at this time.  All issues noted in this document were discussed and addressed.  No physical exam was performed (except for noted visual exam findings with Video Visits).   I connected with Shane Acevedo today at  1:20 PM EDT by a video enabled telemedicine application or telephone and verified that I am speaking with the correct person using two identifiers. Location patient: home Location provider: work or home office Persons participating in the virtual visit: patient, provider  I discussed the limitations, risks, security and privacy concerns of performing an evaluation and management service by telephone and the availability of in person appointments. I also discussed with the patient that there may be a patient responsible charge related to this service. The patient expressed understanding and agreed to proceed.  Interactive audio and video telecommunications were attempted between this provider and patient, however failed, due to patient having technical difficulties connecting to video.  Video connection attempted x3, unsuccessful.  Visit finished over the phone.  HPI:  Patient and I connected via video due to complaints of pain in right side of head near temple area.  States sometimes the pain can be throbbing.  Does respond to Tylenol dose.  Patient also was wondering if this pain in the right side of head could be related to him swimming in the pool and getting water in his ears.  Denies neurological changes.  Denies facial droop or speech difficulty.  Denies visual changes or vision loss.  Denies one-sided extremity weakness.  Denies sharp thunderclap type pain in head.  Denies  fever or chills.   ROS: See pertinent positives and negatives per HPI.  Past Medical History:  Diagnosis Date  . Allergy   . Anxiety   . Depression   . Heart disease   . Hypertension     Past Surgical History:  Procedure Laterality Date  . CORONARY STENT PLACEMENT Right   . TONSILLECTOMY    . TOTAL HIP ARTHROPLASTY Right     Family History  Problem Relation Age of Onset  . Alcoholism Other   . Arthritis Other   . Lung cancer Other   . Mental illness Other   . Emphysema Mother   . Bipolar disorder Father   . Coronary artery disease Sister   . Schizophrenia Sister    Social History   Tobacco Use  . Smoking status: Never Smoker  . Smokeless tobacco: Never Used  Substance Use Topics  . Alcohol use: Yes    Alcohol/week: 4.0 standard drinks    Types: 2 Glasses of wine, 2 Cans of beer per week    Current Outpatient Medications:  .  amLODipine (NORVASC) 10 MG tablet, TAKE 1 TABLET DAILY, Disp: 90 tablet, Rfl: 4 .  aspirin EC 81 MG tablet, Take 81 mg by mouth daily., Disp: , Rfl:  .  atorvastatin (LIPITOR) 40 MG tablet, Take 1 tablet (40 mg total) by mouth daily., Disp: 90 tablet, Rfl: 1 .  guaiFENesin (MUCINEX) 600 MG 12 hr tablet, Take 1 tablet (600 mg total) by mouth 2 (two) times daily., Disp: 20 tablet, Rfl: 1 .  lamoTRIgine (LAMICTAL) 200 MG tablet, TAKE ONE-HALF (1/2) TABLET DAILY WITH SUPPER, Disp:  45 tablet, Rfl: 4 .  levofloxacin (LEVAQUIN) 500 MG tablet, Take 1 tablet (500 mg total) by mouth daily., Disp: 7 tablet, Rfl: 0 .  mirtazapine (REMERON) 7.5 MG tablet, TAKE 1 TABLET AT BEDTIME, Disp: 90 tablet, Rfl: 4 .  Multiple Vitamins-Minerals (CENTRUM SILVER PO), Take by mouth., Disp: , Rfl:  .  omeprazole (PRILOSEC) 20 MG capsule, Take 2 capsules (40 mg total) by mouth daily., Disp: 28 capsule, Rfl: 0 .  oxybutynin (DITROPAN-XL) 5 MG 24 hr tablet, TAKE 1 TABLET AT BEDTIME, Disp: 90 tablet, Rfl: 4 .  ezetimibe (ZETIA) 10 MG tablet, Take 1 tablet (10 mg total) by  mouth daily., Disp: 90 tablet, Rfl: 3 .  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  We will start patient on high-dose steroid taper.  We will also get blood work occluding CBC, sed rate and CRP.  Patient's labs did reveal an elevated CRP so this helps confirm my suspicion for temporal arteritis.  Advised patient if pain in head gets any worse, develops any neurological symptoms, develops any strokelike symptoms to call office and or go to emergency department right away for evaluation.   I discussed the assessment and treatment plan with the patient. The patient was provided an opportunity to ask questions and all were answered. The patient agreed with the plan and demonstrated an understanding of the instructions.   The patient was advised to call back or seek an in-person evaluation if the symptoms worsen or if the condition fails to improve as anticipated.  I provided 15 minutes of non-face-to-face time during this encounter.   Jodelle Green, FNP

## 2018-08-20 LAB — C-REACTIVE PROTEIN: CRP: 11.3 mg/L — ABNORMAL HIGH (ref <8.0)

## 2018-08-20 LAB — CBC WITH DIFFERENTIAL/PLATELET
Absolute Monocytes: 637 cells/uL (ref 200–950)
Basophils Absolute: 21 cells/uL (ref 0–200)
Basophils Relative: 0.3 %
Eosinophils Absolute: 49 cells/uL (ref 15–500)
Eosinophils Relative: 0.7 %
HCT: 40.8 % (ref 38.5–50.0)
Hemoglobin: 13.3 g/dL (ref 13.2–17.1)
Lymphs Abs: 2982 cells/uL (ref 850–3900)
MCH: 29.2 pg (ref 27.0–33.0)
MCHC: 32.6 g/dL (ref 32.0–36.0)
MCV: 89.7 fL (ref 80.0–100.0)
MPV: 10.1 fL (ref 7.5–12.5)
Monocytes Relative: 9.1 %
Neutro Abs: 3311 cells/uL (ref 1500–7800)
Neutrophils Relative %: 47.3 %
Platelets: 249 10*3/uL (ref 140–400)
RBC: 4.55 10*6/uL (ref 4.20–5.80)
RDW: 13.3 % (ref 11.0–15.0)
Total Lymphocyte: 42.6 %
WBC: 7 10*3/uL (ref 3.8–10.8)

## 2018-08-20 LAB — SEDIMENTATION RATE: Sed Rate: 6 mm/h (ref 0–20)

## 2018-08-22 ENCOUNTER — Encounter: Payer: Self-pay | Admitting: Family Medicine

## 2018-08-23 ENCOUNTER — Encounter: Payer: Self-pay | Admitting: Family Medicine

## 2018-08-26 ENCOUNTER — Encounter: Payer: Self-pay | Admitting: Family Medicine

## 2018-08-28 NOTE — Telephone Encounter (Signed)
We typically don't order hs-CRP The one he has completed is sufficient It is also nonspecific as it will be elevated with arthritis or any inflammation in the body In the case of elevated CRP, we push for  more aggressive management of his risk factors (sugars, lifestyle/weight loss, lower cholesterol and sugars, exercise, etc)

## 2018-08-28 NOTE — Telephone Encounter (Signed)
See other message

## 2018-08-29 NOTE — Telephone Encounter (Signed)
Responded to prior message.

## 2018-08-30 ENCOUNTER — Other Ambulatory Visit: Payer: Self-pay | Admitting: Family Medicine

## 2018-09-06 ENCOUNTER — Telehealth: Payer: Self-pay | Admitting: Cardiovascular Disease

## 2018-09-06 ENCOUNTER — Encounter: Payer: Self-pay | Admitting: Family Medicine

## 2018-09-06 DIAGNOSIS — I6529 Occlusion and stenosis of unspecified carotid artery: Secondary | ICD-10-CM

## 2018-09-06 DIAGNOSIS — R0602 Shortness of breath: Secondary | ICD-10-CM

## 2018-09-06 NOTE — Patient Instructions (Signed)
Orthostatic Hypotension Blood pressure is a measurement of how strongly, or weakly, your blood is pressing against the walls of your arteries. Orthostatic hypotension is a sudden drop in blood pressure that happens when you quickly change positions, such as when you get up from sitting or lying down. Arteries are blood vessels that carry blood from your heart throughout your body. When blood pressure is too low, you may not get enough blood to your brain or to the rest of your organs. This can cause weakness, light-headedness, rapid heartbeat, and fainting. This can last for just a few seconds or for up to a few minutes. Orthostatic hypotension is usually not a serious problem. However, if it happens frequently or gets worse, it may be a sign of something more serious. What are the causes? This condition may be caused by:  Sudden changes in posture, such as standing up quickly after you have been sitting or lying down.  Blood loss.  Loss of body fluids (dehydration).  Heart problems.  Hormone (endocrine) problems.  Pregnancy.  Severe infection.  Lack of certain nutrients.  Severe allergic reactions (anaphylaxis).  Certain medicines, such as blood pressure medicine or medicines that make the body lose excess fluids (diuretics). Sometimes, this condition can be caused by not taking medicine as directed, such as taking too much of a certain medicine. What increases the risk? The following factors may make you more likely to develop this condition:  Age. Risk increases as you get older.  Conditions that affect the heart or the central nervous system.  Taking certain medicines, such as blood pressure medicine or diuretics.  Being pregnant. What are the signs or symptoms? Symptoms of this condition may include:  Weakness.  Light-headedness.  Dizziness.  Blurred vision.  Fatigue.  Rapid heartbeat.  Fainting, in severe cases. How is this diagnosed? This condition is  diagnosed based on:  Your medical history.  Your symptoms.  Your blood pressure measurement. Your health care provider will check your blood pressure when you are: ? Lying down. ? Sitting. ? Standing. A blood pressure reading is recorded as two numbers, such as "120 over 80" (or 120/80). The first ("top") number is called the systolic pressure. It is a measure of the pressure in your arteries as your heart beats. The second ("bottom") number is called the diastolic pressure. It is a measure of the pressure in your arteries when your heart relaxes between beats. Blood pressure is measured in a unit called mm Hg. Healthy blood pressure for most adults is 120/80. If your blood pressure is below 90/60, you may be diagnosed with hypotension. Other information or tests that may be used to diagnose orthostatic hypotension include:  Your other vital signs, such as your heart rate and temperature.  Blood tests.  Tilt table test. For this test, you will be safely secured to a table that moves you from a lying position to an upright position. Your heart rhythm and blood pressure will be monitored during the test. How is this treated? This condition may be treated by:  Changing your diet. This may involve eating more salt (sodium) or drinking more water.  Taking medicines to raise your blood pressure.  Changing the dosage of certain medicines you are taking that might be lowering your blood pressure.  Wearing compression stockings. These stockings help to prevent blood clots and reduce swelling in your legs. In some cases, you may need to go to the hospital for:  Fluid replacement. This means you will   receive fluids through an IV.  Blood replacement. This means you will receive donated blood through an IV (transfusion).  Treating an infection or heart problems, if this applies.  Monitoring. You may need to be monitored while medicines that you are taking wear off. Follow these instructions  at home: Eating and drinking   Drink enough fluid to keep your urine pale yellow.  Eat a healthy diet, and follow instructions from your health care provider about eating or drinking restrictions. A healthy diet includes: ? Fresh fruits and vegetables. ? Whole grains. ? Lean meats. ? Low-fat dairy products.  Eat extra salt only as directed. Do not add extra salt to your diet unless your health care provider told you to do that.  Eat frequent, small meals.  Avoid standing up suddenly after eating. Medicines  Take over-the-counter and prescription medicines only as told by your health care provider. ? Follow instructions from your health care provider about changing the dosage of your current medicines, if this applies. ? Do not stop or adjust any of your medicines on your own. General instructions   Wear compression stockings as told by your health care provider.  Get up slowly from lying down or sitting positions. This gives your blood pressure a chance to adjust.  Avoid hot showers and excessive heat as directed by your health care provider.  Return to your normal activities as told by your health care provider. Ask your health care provider what activities are safe for you.  Do not use any products that contain nicotine or tobacco, such as cigarettes, e-cigarettes, and chewing tobacco. If you need help quitting, ask your health care provider.  Keep all follow-up visits as told by your health care provider. This is important. Contact a health care provider if you:  Vomit.  Have diarrhea.  Have a fever for more than 2-3 days.  Feel more thirsty than usual.  Feel weak and tired. Get help right away if you:  Have chest pain.  Have a fast or irregular heartbeat.  Develop numbness in any part of your body.  Cannot move your arms or your legs.  Have trouble speaking.  Become sweaty or feel light-headed.  Faint.  Feel short of breath.  Have trouble staying  awake.  Feel confused. Summary  Orthostatic hypotension is a sudden drop in blood pressure that happens when you quickly change positions.  Orthostatic hypotension is usually not a serious problem.  It is diagnosed by having your blood pressure taken lying down, sitting, and then standing.  It may be treated by changing your diet or adjusting your medicines. This information is not intended to replace advice given to you by your health care provider. Make sure you discuss any questions you have with your health care provider. Document Released: 12/19/2001 Document Revised: 06/24/2017 Document Reviewed: 06/24/2017 Elsevier Patient Education  2020 [employer].  How to Take Your Blood Pressure You can take your blood pressure at home with a machine. You may need to check your blood pressure at home:  To check if you have high blood pressure (hypertension).  To check your blood pressure over time.  To make sure your blood pressure medicine is working. Supplies needed: You will need a blood pressure machine, or monitor. You can buy one at a drugstore or online. When choosing one:  Choose one with an arm cuff.  Choose one that wraps around your upper arm. Only one finger should fit between your arm and the cuff.  Do not choose one that measures your blood pressure from your wrist or finger. Your doctor can suggest a monitor. How to prepare Avoid these things for 30 minutes before checking your blood pressure:  Drinking caffeine.  Drinking alcohol.  Eating.  Smoking.  Exercising. Five minutes before checking your blood pressure:  Pee.  Sit in a dining chair. Avoid sitting in a soft couch or armchair.  Be quiet. Do not talk. How to take your blood pressure Follow the instructions that came with your machine. If you have a digital blood pressure monitor, these may be the instructions: 1. Sit up straight. 2. Place your feet on the floor. Do not cross your ankles or  legs. 3. Rest your left arm at the level of your heart. You may rest it on a table, desk, or chair. 4. Pull up your shirt sleeve. 5. Wrap the blood pressure cuff around the upper part of your left arm. The cuff should be 1 inch (2.5 cm) above your elbow. It is best to wrap the cuff around bare skin. 6. Fit the cuff snugly around your arm. You should be able to place only one finger between the cuff and your arm. 7. Put the cord inside the groove of your elbow. 8. Press the power button. 9. Sit quietly while the cuff fills with air and loses air. 10. Write down the numbers on the screen. 11. Wait 2-3 minutes and then repeat steps 1-10. What do the numbers mean? Two numbers make up your blood pressure. The first number is called systolic pressure. The second is called diastolic pressure. An example of a blood pressure reading is "120 over 80" (or 120/80). If you are an adult and do not have a medical condition, use this guide to find out if your blood pressure is normal: Normal  First number: below 120.  Second number: below 80. Elevated  First number: 120-129.  Second number: below 80. Hypertension stage 1  First number: 130-139.  Second number: 80-89. Hypertension stage 2  First number: 140 or above.  Second number: 51 or above. Your blood pressure is above normal even if only the top or bottom number is above normal. Follow these instructions at home:  Check your blood pressure as often as your doctor tells you to.  Take your monitor to your next doctor's appointment. Your doctor will: ? Make sure you are using it correctly. ? Make sure it is working right.  Make sure you understand what your blood pressure numbers should be.  Tell your doctor if your medicines are causing side effects. Contact a doctor if:  Your blood pressure keeps being high. Get help right away if:  Your first blood pressure number is higher than 180.  Your second blood pressure number is  higher than 120. This information is not intended to replace advice given to you by your health care provider. Make sure you discuss any questions you have with your health care provider. Document Released: 12/12/2007 Document Revised: 12/11/2016 Document Reviewed: 06/07/2015 Elsevier Patient Education  2020 [place].  Blood Pressure Record Sheet To take your blood pressure, you will need a blood pressure machine. You can buy a blood pressure machine (blood pressure monitor) at your clinic, drug store, or online. When choosing one, consider:  An automatic monitor that has an arm cuff.  A cuff that wraps snugly around your upper arm. You should be able to fit only one finger between your arm and the cuff.  A  device that stores blood pressure reading results.  Do not choose a monitor that measures your blood pressure from your wrist or finger. Follow your health care provider's instructions for how to take your blood pressure. To use this form:  Get one reading in the morning (a.m.) before you take any medicines.  Get one reading in the evening (p.m.) before supper.  Take at least 2 readings with each blood pressure check. This makes sure the results are correct. Wait 1-2 minutes between measurements.  Write down the results in the spaces on this form.  Repeat this once a week, or as told by your health care provider.  Make a follow-up appointment with your health care provider to discuss the results. Blood pressure log Date: _______________________  a.m. _____________________(1st reading) _____________________(2nd reading)  p.m. _____________________(1st reading) _____________________(2nd reading) Date: _______________________  a.m. _____________________(1st reading) _____________________(2nd reading)  p.m. _____________________(1st reading) _____________________(2nd reading) Date: _______________________  a.m. _____________________(1st reading) _____________________(2nd  reading)  p.m. _____________________(1st reading) _____________________(2nd reading) Date: _______________________  a.m. _____________________(1st reading) _____________________(2nd reading)  p.m. _____________________(1st reading) _____________________(2nd reading) Date: _______________________  a.m. _____________________(1st reading) _____________________(2nd reading)  p.m. _____________________(1st reading) _____________________(2nd reading) This information is not intended to replace advice given to you by your health care provider. Make sure you discuss any questions you have with your health care provider. Document Released: 09/27/2002 Document Revised: 02/26/2017 Document Reviewed: 12/29/2016 Elsevier Patient Education  2020 [employer].

## 2018-09-06 NOTE — Telephone Encounter (Signed)
Spoke with patient and reviewed his messages sent through [facility] and reviewed that I did discuss with Dr. Rockey Situ. Patient described his episode in the shower when trying to wash his feet and it sounded like a orthostatic issue and he denies shortness of breath or dizziness with routine activities. Given his questions and concerns Dr. Rockey Situ would like him to have repeat carotid and echo to check things and wanted him to keep the scheduled appointment 09/20/18. Instructed him to check blood pressures daily and keep log of those readings so we can see a trend of those. Also requested that he check orthostatic blood pressures as well. Instructed him to be safe when changing positions as this could make the dizziness come on. Advised that I would enter orders for these tests to be done and would have someone to call and schedule with him tomorrow. Let him know that I could not guarantee the testing could be done before the appointment but we would try. He verbalized understanding, agreement with plan, and had no further questions at this time.

## 2018-09-07 NOTE — Telephone Encounter (Signed)
Scheduled carotid   Waiting on possible cancellation for echo before 9/8 appt .  Patient aware

## 2018-09-12 ENCOUNTER — Ambulatory Visit (INDEPENDENT_AMBULATORY_CARE_PROVIDER_SITE_OTHER): Payer: Medicare Other

## 2018-09-12 ENCOUNTER — Other Ambulatory Visit: Payer: Self-pay

## 2018-09-12 DIAGNOSIS — I6529 Occlusion and stenosis of unspecified carotid artery: Secondary | ICD-10-CM

## 2018-09-13 NOTE — Telephone Encounter (Signed)
Pain in temple could be in nner ear or temporal arteritis (that is what PMD though it was) For the latter, bx needs to be done, and if posistive, treated with steroids Would discuss with PMD

## 2018-09-14 ENCOUNTER — Ambulatory Visit (INDEPENDENT_AMBULATORY_CARE_PROVIDER_SITE_OTHER): Payer: Medicare Other

## 2018-09-14 ENCOUNTER — Other Ambulatory Visit: Payer: Self-pay

## 2018-09-14 DIAGNOSIS — R0602 Shortness of breath: Secondary | ICD-10-CM | POA: Diagnosis not present

## 2018-09-15 ENCOUNTER — Encounter: Payer: Self-pay | Admitting: Family Medicine

## 2018-09-18 ENCOUNTER — Encounter: Payer: Self-pay | Admitting: Family Medicine

## 2018-09-19 NOTE — Progress Notes (Signed)
Cardiology Office Note  Date:  09/20/2018   ID:  Shane Acevedo, DOB 25-Jan-1933, MRN [id number]  PCP:  Leone Haven, MD   Chief Complaint  Patient presents with  . other    Echo/ Carotid US. Medications reviewed verbally.     HPI:  Shane Acevedo is a 85 y.o. 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 HTN History of anxiety and depression Who presents for f/u of his coronary disease, and carotid arterial disease  In follow-up today reports that he continues to have some pain in his temporal area on the right Concerned as he is on prednisone with no significant improvement in symptoms Seems to hurt if he moves his head side to side Scheduled to see specialist this Thursday  Recent echocardiogram discussed normal cardiac function, no significant valve disease  Carotid ultrasound less than 39% disease bilaterally Performed September 12, 2018  CRP and sed rate normal today, labs reviewed with him Prior lab work , CRP was 11.3, sed rate 6  [place] up 5 pounds [place]   chronic Shoulder, knee and hand arthritis  Swims for work out Denies any shortness of breath or chest pain on exertion  Wife has debilitating medical issues This is heavy burden on him  EKG personally reviewed by myself on todays visit Shows normal sinus rhythm with rate 47 bpm no significant ST or T wave changes  Past medical history reviewed Previous anginal symptoms discussed with him, had chest discomfort and shortness of breath  stent placement 1997 denies having any interventions since that time  Previous testing reviewed  Treadmill stress test 11/2015 Echo 03/2015: normal EF, mild AI and MR Carotid u/s 03/2015: mild to moderate b/l    PMH:   has a past medical history of Allergy,  Anxiety, Depression, Heart disease, and Hypertension.  PSH:    Past Surgical History:  Procedure Laterality Date  . CORONARY STENT PLACEMENT Right   . TONSILLECTOMY    . TOTAL HIP ARTHROPLASTY Right     Current Outpatient Medications  Medication Sig Dispense Refill  . amLODipine (NORVASC) 10 MG tablet TAKE 1 TABLET DAILY 90 tablet 4  . aspirin EC 81 MG tablet Take 81 mg by mouth daily.    Marland Kitchen atorvastatin (LIPITOR) 40 MG tablet Take 1 tablet (40 mg total) by mouth daily. 90 tablet 1  . guaiFENesin (MUCINEX) 600 MG 12 hr tablet Take 1 tablet (600 mg total) by mouth 2 (two) times daily. 20 tablet 1  . lamoTRIgine (LAMICTAL) 200 MG tablet TAKE ONE-HALF (1/2) TABLET DAILY WITH SUPPER 45 tablet 4  . levofloxacin (LEVAQUIN) 500 MG tablet Take 1 tablet (500 mg total) by mouth daily. 7 tablet 0  . mirtazapine (REMERON) 7.5 MG tablet TAKE 1 TABLET AT BEDTIME 90 tablet 4  . Multiple Vitamins-Minerals (CENTRUM SILVER PO) Take by mouth.    Marland Kitchen omeprazole (PRILOSEC) 20 MG capsule Take 2 capsules (40 mg total) by mouth daily. 28 capsule 0  . oxybutynin (DITROPAN-XL) 5 MG 24 hr tablet TAKE 1 TABLET AT BEDTIME 90 tablet 4  . ezetimibe (ZETIA) 10 MG tablet Take 1 tablet (10 mg total) by mouth daily. 90 tablet 3   No current facility-administered medications for this visit.      Allergies:   Patient has no  known allergies.   Social History:  The patient  reports that he has never smoked. He has never used smokeless tobacco. He reports current alcohol use of about 4.0 standard drinks of alcohol per week. He reports that he does not use drugs.   Family History:   family history includes Alcoholism in an other family member; Arthritis in an other family member; Bipolar disorder in his father; Coronary artery disease in his sister; Emphysema in his mother; Lung cancer in an other family member; Mental illness in an other family member; Schizophrenia in his sister.    Review of Systems: Review of Systems   Constitutional: Negative.        Discomfort right temporal region especially with moving head around  Respiratory: Negative.   Cardiovascular: Negative.   Gastrointestinal: Negative.   Musculoskeletal: Negative.   Neurological: Negative.   Psychiatric/Behavioral: Negative.   All other systems reviewed and are negative.   PHYSICAL EXAM: VS:  BP 128/70 (BP Location: Left Arm, Patient Position: Sitting, Cuff Size: Normal)   Pulse (!) 47   Ht 5\' 8"  (1.727 m)   Wt 173 lb (78.5 kg)   SpO2 98%   BMI 26.30 kg/m  , BMI Body mass index is 26.3 kg/m. Constitutional:  oriented to person, place, and time. No distress.  HENT:  Head: Grossly normal Eyes:  no discharge. No scleral icterus.  Neck: No JVD, no carotid bruits  Cardiovascular: Regular rate and rhythm, no murmurs appreciated Pulmonary/Chest: Clear to auscultation bilaterally, no wheezes or rails Abdominal: Soft.  no distension.  no tenderness.  Musculoskeletal: Normal range of motion Neurological:  normal muscle tone. Coordination normal. No atrophy Skin: Skin warm and dry Psychiatric: normal affect, pleasant   Recent Labs: 03/14/2018: ALT 14 03/28/2018: BUN 16; Creatinine, Ser 1.00; Potassium 4.1; Sodium 137 08/19/2018: Hemoglobin 13.3; Platelets 249    Lipid Panel Lab Results  Component Value Date   CHOL 143 12/23/2017   HDL 46 12/23/2017   LDLCALC 81 12/23/2017   TRIG 80 12/23/2017      Wt Readings from Last 3 Encounters:  09/20/18 173 lb (78.5 kg)  09/20/18 172 lb 9.6 oz (78.3 kg)  03/28/18 168 lb 3.2 oz (76.3 kg)      ASSESSMENT AND PLAN:  Coronary artery disease of native artery of native heart with stable angina pectoris ([place]) - Plan: EKG 12-Lead Currently with no symptoms of angina. No further workup at this time. Continue current medication regimen. Stable  OSA (obstructive sleep apnea) - Plan: EKG 12-Lead Reports sleeping well No further work-up  Mixed hyperlipidemia - Plan: EKG 12-Lead Continue  Lipitor 40,  Zetia Routine lab work with Dr. Caryl Bis  History of coronary artery stent placement Prior history of stent 20 years ago  Typically would continue Plavix with an older stent.  Came to our office only on aspirin.  We will continue aspirin alone.  Appears to be stable  Essential hypertension Blood pressure is well controlled on today's visit. No changes made to the medications.  Stable  Anxiety and depression Continue water aerobics, regular exercise Better today, encouragement provided  Smoking history Remote smoking, none recently  Disposition:   F/U  12 months   Total encounter time more than 25 minutes  Greater than 50% was spent in counseling and coordination of care with the patient    No orders of the defined types were placed in this encounter.    Signed, Esmond Plants, M.D., Ph.D. 09/20/2018  [place],  [place] [phone]

## 2018-09-20 ENCOUNTER — Encounter: Payer: Self-pay | Admitting: Family Medicine

## 2018-09-20 ENCOUNTER — Encounter: Payer: Self-pay | Admitting: Cardiovascular Disease

## 2018-09-20 ENCOUNTER — Other Ambulatory Visit: Payer: Self-pay

## 2018-09-20 ENCOUNTER — Ambulatory Visit (INDEPENDENT_AMBULATORY_CARE_PROVIDER_SITE_OTHER): Payer: Medicare Other | Admitting: Cardiovascular Disease

## 2018-09-20 ENCOUNTER — Telehealth: Payer: Self-pay

## 2018-09-20 ENCOUNTER — Ambulatory Visit (INDEPENDENT_AMBULATORY_CARE_PROVIDER_SITE_OTHER): Payer: Medicare Other | Admitting: Family Medicine

## 2018-09-20 VITALS — BP 128/70 | HR 47 | Ht 68.0 in | Wt 173.0 lb

## 2018-09-20 VITALS — BP 118/80 | HR 53 | Temp 97.4°F | Ht 68.0 in | Wt 172.6 lb

## 2018-09-20 DIAGNOSIS — R51 Headache: Secondary | ICD-10-CM | POA: Diagnosis not present

## 2018-09-20 DIAGNOSIS — F419 Anxiety disorder, unspecified: Secondary | ICD-10-CM | POA: Diagnosis not present

## 2018-09-20 DIAGNOSIS — I6529 Occlusion and stenosis of unspecified carotid artery: Secondary | ICD-10-CM

## 2018-09-20 DIAGNOSIS — I25118 Atherosclerotic heart disease of native coronary artery with other forms of angina pectoris: Secondary | ICD-10-CM

## 2018-09-20 DIAGNOSIS — R0602 Shortness of breath: Secondary | ICD-10-CM

## 2018-09-20 DIAGNOSIS — F329 Major depressive disorder, single episode, unspecified: Secondary | ICD-10-CM

## 2018-09-20 DIAGNOSIS — Z87891 Personal history of nicotine dependence: Secondary | ICD-10-CM

## 2018-09-20 DIAGNOSIS — I700 Atherosclerosis of aorta: Secondary | ICD-10-CM

## 2018-09-20 DIAGNOSIS — F32A Depression, unspecified: Secondary | ICD-10-CM

## 2018-09-20 DIAGNOSIS — R519 Headache, unspecified: Secondary | ICD-10-CM | POA: Insufficient documentation

## 2018-09-20 DIAGNOSIS — Z23 Encounter for immunization: Secondary | ICD-10-CM | POA: Diagnosis not present

## 2018-09-20 DIAGNOSIS — I739 Peripheral vascular disease, unspecified: Secondary | ICD-10-CM | POA: Diagnosis not present

## 2018-09-20 LAB — SEDIMENTATION RATE: Sed Rate: 6 mm/hr (ref 0–20)

## 2018-09-20 LAB — C-REACTIVE PROTEIN: CRP: <1 mg/dL (ref 0.5–20.0)

## 2018-09-20 NOTE — Telephone Encounter (Signed)
Copied from [place] [id number]. Topic: General - Other >> Sep 20, 2018 12:48 PM Richardo Priest, NT wrote: Reason for CRM: Patient called in stating he would like lab results back by Thursday for vascular surgeon. Please advise.

## 2018-09-20 NOTE — Patient Instructions (Signed)
Nice to see you. Please try Claritin to see if that helps with your symptoms. We will get you to see the vascular surgeons to see if they think a biopsy is necessary. If you develop vision changes or worsening headaches please seek medical attention.

## 2018-09-20 NOTE — Patient Instructions (Signed)

## 2018-09-20 NOTE — Progress Notes (Signed)
[name], MD Phone: [phone]  Shane Acevedo is a 85 y.o. male who presents today for follow-up.  Headache: Patient notes having right-sided temporal headache off and on for a couple of months.  It did go away for about 2 weeks ago though came back more recently.  He notes a sensation of fullness in his head and notes a discomfort in his right temple if he shakes his head side to side.  He does have some discomfort in his right temple at without movement of his head.  He notes no ear pain.  Notes some tinnitus if he opens his jaw for the last 3 to 4 months.  Minimal hearing loss.  No fevers.  No chest pain.  No exertional shortness of breath.  No syncope.  No vision changes.  He has been on prednisone for 3 to 4 days and has not noticed any difference in his headache.  Feels as though his equilibrium is a little bit off as well with this.  No vertigo.  Does note his jaw is sore if he opens it though there is no claudication with chewing.  Social History   Tobacco Use  Smoking Status Never Smoker  Smokeless Tobacco Never Used     ROS see history of present illness  Objective  Physical Exam Vitals:   09/20/18 0839  BP: 118/80  Pulse: (!) 53  Temp: (!) 97.4 F (36.3 C)  SpO2: 99%    BP Readings from Last 3 Encounters:  09/20/18 128/70  09/20/18 118/80  03/28/18 (!) 158/80   Wt Readings from Last 3 Encounters:  09/20/18 173 lb (78.5 kg)  09/20/18 172 lb 9.6 oz (78.3 kg)  03/28/18 168 lb 3.2 oz (76.3 kg)    Physical Exam Constitutional:      General: He is not in acute distress.    Appearance: He is not diaphoretic.  HENT:     Head:     Comments: Mild tenderness of the right temple, no apparent vascular enlargement in the right temple, some mild tenderness at the TMJ on the right    Right Ear: Tympanic membrane normal.     Left Ear: Tympanic membrane normal.     Ears:     Comments: Cerumen noted in the right ear canal, this was irrigated by CMA with  movement of the cerumen enough to view the TM though not full removal of the cerumen, patient tolerated this well Eyes:     Conjunctiva/sclera: Conjunctivae normal.     Pupils: Pupils are equal, round, and reactive to light.  Cardiovascular:     Rate and Rhythm: Normal rate and regular rhythm.     Heart sounds: Normal heart sounds.  Pulmonary:     Effort: Pulmonary effort is normal.     Breath sounds: Normal breath sounds.  Skin:    General: Skin is warm and dry.  Neurological:     Mental Status: He is alert.      Assessment/Plan: Please see individual problem list.  Acute nonintractable headache Patient with right-sided headache new onset over the last several months.  Differential includes temporal arteritis, TMJ, eustachian tube dysfunction, or sinus issue.  ESR was low though CRP was elevated.  This could indicate temporal arteritis though his description of the discomfort and his symptoms did not necessarily fit with this.  We will have him see a vascular surgeon to help determine if he needs a temporal artery biopsy.  We will attempt to get this scheduled for  sometime in the next week.  We will recheck his inflammatory markers.  He will continue on prednisone.  He will try Claritin to see if that would help with the eustachian tube dysfunction or possible sinus issues.  He will monitor his symptoms.  He is given return precautions.   Health Maintenance: td vaccine sent to pharmacy  Orders Placed This Encounter  Procedures  . Flu Vaccine QUAD High Dose(Fluad)  . C-reactive protein  . Sedimentation rate  . Ambulatory referral to Vascular Surgery    Referral Priority:   Urgent    Referral Type:   Surgical    Referral Reason:   Specialty Services Required    Requested Specialty:   Vascular Surgery    Number of Visits Requested:   1    Meds ordered this encounter  Medications  . diptheria-tetanus toxoids (TDVAX) 2-2 LF/0.5ML injection    Sig: Inject 0.5 mLs into the muscle  once for 1 dose.    Dispense:  0.5 mL    Refill:  0     [name], MD [place]

## 2018-09-21 ENCOUNTER — Ambulatory Visit: Payer: Medicare Other

## 2018-09-21 NOTE — Telephone Encounter (Signed)
See below. Had labs drawn yesterday (09/20/18)

## 2018-09-21 NOTE — Telephone Encounter (Signed)
Please let the patient know that the vascular surgeon will be able to see the lab results in the system.  His inflammatory markers were both in the normal range.  Thanks.

## 2018-09-22 ENCOUNTER — Encounter (INDEPENDENT_AMBULATORY_CARE_PROVIDER_SITE_OTHER): Payer: Medicare Other | Admitting: Vascular Surgery

## 2018-09-22 ENCOUNTER — Telehealth (INDEPENDENT_AMBULATORY_CARE_PROVIDER_SITE_OTHER): Payer: Self-pay

## 2018-09-22 ENCOUNTER — Ambulatory Visit (INDEPENDENT_AMBULATORY_CARE_PROVIDER_SITE_OTHER): Payer: Medicare Other | Admitting: Nurse Practitioner

## 2018-09-22 ENCOUNTER — Other Ambulatory Visit: Payer: Self-pay

## 2018-09-22 ENCOUNTER — Encounter (INDEPENDENT_AMBULATORY_CARE_PROVIDER_SITE_OTHER): Payer: Self-pay | Admitting: Nurse Practitioner

## 2018-09-22 ENCOUNTER — Encounter: Payer: Self-pay | Admitting: *Deleted

## 2018-09-22 VITALS — BP 124/72 | HR 54 | Resp 16 | Ht 68.0 in | Wt 174.8 lb

## 2018-09-22 DIAGNOSIS — R51 Headache: Secondary | ICD-10-CM

## 2018-09-22 DIAGNOSIS — I6529 Occlusion and stenosis of unspecified carotid artery: Secondary | ICD-10-CM | POA: Diagnosis not present

## 2018-09-22 DIAGNOSIS — E782 Mixed hyperlipidemia: Secondary | ICD-10-CM

## 2018-09-22 DIAGNOSIS — K219 Gastro-esophageal reflux disease without esophagitis: Secondary | ICD-10-CM | POA: Diagnosis not present

## 2018-09-22 DIAGNOSIS — R519 Headache, unspecified: Secondary | ICD-10-CM

## 2018-09-22 MED ORDER — TDVAX 2-2 LF/0.5ML IM SUSP: 0.5000 mL | Freq: Once | INTRAMUSCULAR | 0 refills | Status: AC

## 2018-09-22 NOTE — Assessment & Plan Note (Signed)
Patient with right-sided headache new onset over the last several months.  Differential includes temporal arteritis, TMJ, eustachian tube dysfunction, or sinus issue.  ESR was low though CRP was elevated.  This could indicate temporal arteritis though his description of the discomfort and his symptoms did not necessarily fit with this.  We will have him see a vascular surgeon to help determine if he needs a temporal artery biopsy.  We will attempt to get this scheduled for sometime in the next week.  We will recheck his inflammatory markers.  He will continue on prednisone.  He will try Claritin to see if that would help with the eustachian tube dysfunction or possible sinus issues.  He will monitor his symptoms.  He is given return precautions. 

## 2018-09-22 NOTE — Telephone Encounter (Signed)
Patient was seen today in the office and is now scheduled with Dr. Delana Meyer for surgery on 09/28/2018 at [facility] Patient will do his pre-op and Covid testing on 09/23/2018 @ 11:45 am at the [facility]. Pre-procedure instructions were discussed and will be mailed to the patient.

## 2018-09-22 NOTE — Telephone Encounter (Signed)
Sent mychart

## 2018-09-23 ENCOUNTER — Encounter: Payer: Self-pay | Admitting: Family Medicine

## 2018-09-23 ENCOUNTER — Other Ambulatory Visit: Admission: RE | Admit: 2018-09-23 | Payer: Medicare Other | Source: Ambulatory Visit

## 2018-09-23 ENCOUNTER — Encounter (INDEPENDENT_AMBULATORY_CARE_PROVIDER_SITE_OTHER): Payer: Self-pay | Admitting: Nurse Practitioner

## 2018-09-23 NOTE — Progress Notes (Signed)
SUBJECTIVE:  Patient ID: Shane Acevedo, male    DOB: 04/07/1933, 85 y.o.   MRN: [id number] Chief Complaint  Patient presents with  . New Patient (Initial Visit)    ref Caryl Bis for temporal artery biopsy    HPI  Shane Acevedo is a 85 y.o. male there is referred by his primary care provider Dr. Caryl Bis, with concern for temporal arteritis.  The patient states that this all began several weeks ago when he was in the shower and bent over and had a near syncopal event.  The patient was concerned initially that it may have something to do with his heart or his carotid arteries however those have all been ruled out by his cardiologist.  The patient also subsequently noticed some vision changes as well as jaw pain and pain around his right temporal artery with palpation.  He stated that the pain went away however now symptoms are back.  The patient has recently been started on prednisone with several doses left.  He denies any fever, chills, nausea, vomiting or diarrhea.  He denies any subsequent presyncopal episodes after the initial incident.  The patient endorses having memory changes here recently but nothing consistent with any TIA or strokelike symptoms.  Past Medical History:  Diagnosis Date  . Allergy   . Anxiety   . Depression   . Heart disease   . Hypertension     Past Surgical History:  Procedure Laterality Date  . CORONARY STENT PLACEMENT Right   . TONSILLECTOMY    . TOTAL HIP ARTHROPLASTY Right     Social History   Socioeconomic History  . Marital status: Married    Spouse name: Not on file  . Number of children: Not on file  . Years of education: Not on file  . Highest education level: Not on file  Occupational History  . Not on file  Social Needs  . Financial resource strain: Not hard at all  . Food insecurity    Worry: Never true    Inability: Never true  . Transportation needs    Medical: No    Non-medical: No  Tobacco Use  . Smoking  status: Never Smoker  . Smokeless tobacco: Never Used  Substance and Sexual Activity  . Alcohol use: Yes    Alcohol/week: 4.0 standard drinks    Types: 2 Glasses of wine, 2 Cans of beer per week  . Drug use: No  . Sexual activity: Not Currently  Lifestyle  . Physical activity    Days per week: 2 days    Minutes per session: 60 min  . Stress: Not at all  Relationships  . Social Herbalist on phone: Not on file    Gets together: Not on file    Attends religious service: Not on file    Active member of club or organization: Not on file    Attends meetings of clubs or organizations: Not on file    Relationship status: Not on file  . Intimate partner violence    Fear of current or ex partner: No    Emotionally abused: No    Physically abused: No    Forced sexual activity: No  Other Topics Concern  . Not on file  Social History Narrative  . Not on file    Family History  Problem Relation Age of Onset  . Alcoholism Other   . Arthritis Other   . Lung cancer Other   . Mental illness Other   .  Emphysema Mother   . Bipolar disorder Father   . Coronary artery disease Sister   . Schizophrenia Sister     No Known Allergies   Review of Systems   Review of Systems: Negative Unless Checked Constitutional: _0 Weight loss  _1 Fever  _2 Chills Cardiac: _3 Chest pain   _4  Atrial Fibrillation  _5 Palpitations   _6 Shortness of breath when laying flat   _7 Shortness of breath with exertion. _8 Shortness of breath at rest Vascular:  _9 Pain in legs with walking   _10 Pain in legs with standing _11 Pain in legs when laying flat   _12 Claudication    _13 Pain in feet when laying flat    _14 History of DVT   _15 Phlebitis   _16 Swelling in legs   _17 Varicose veins   _18 Non-healing ulcers Pulmonary:   _19 Uses home oxygen   _20 Productive cough   _21 Hemoptysis   _22 Wheeze  _23 COPD   _24 Asthma Neurologic:  _25 Dizziness   _26 Seizures  _27 Blackouts _28 History of stroke   _29 History of TIA  _30 Aphasia   _31 Temporary  Blindness   _32 Weakness or numbness in arm   _33 Weakness or numbness in leg Musculoskeletal:   _34 Joint swelling   _35 Joint pain   _36 Low back pain  _37  History of Knee Replacement _38 Arthritis _39 back Surgeries  _40  Spinal Stenosis    Hematologic:  _41 Easy bruising  _42 Easy bleeding   _43 Hypercoagulable state   _44 Anemic Gastrointestinal:  _45 Diarrhea   _46 Vomiting  _47 Gastroesophageal reflux/heartburn   _48 Difficulty swallowing. _49 Abdominal pain Genitourinary:  _50 Chronic kidney disease   _51 Difficult urination  _52 Anuric   _53 Blood in urine _54 Frequent urination  _55 Burning with urination   _56 Hematuria Skin:  _57 Rashes   _58 Ulcers _59 Wounds Psychological:  _60 History of anxiety   _61  History of major depression  _62  Memory Difficulties      OBJECTIVE:   Physical Exam  BP 124/72 (BP Location: Right Arm)   Pulse (!) 54   Resp 16   Ht _63  (1.727 m)   Wt 174 lb 12.8 oz (79.3 kg)   BMI 26.58 kg/m   Gen: WD/WN, NAD Head: [place]/AT, No temporalis wasting.  Ear/Nose/Throat: Hearing grossly intact, nares w/o erythema or drainage Eyes: PER, EOMI, sclera nonicteric.  Neck: Supple, no masses.  No JVD.  Pulmonary:  Good air movement, no use of accessory muscles.  Cardiac: RRR Vascular:  Temporal artery not present, somewhat tender to palpation Vessel Right Left  Radial Palpable Palpable   Gastrointestinal: soft, non-distended. No guarding/no peritoneal signs.  Musculoskeletal: M/S 5/5 throughout.  No deformity or atrophy.  Neurologic: Pain and light touch intact in extremities.  Symmetrical.  Speech is fluent. Motor exam as listed above. Psychiatric: Judgment intact, Mood & affect appropriate for pt's clinical situation. Dermatologic: No Venous rashes. No Ulcers Noted.  No changes consistent with cellulitis. Lymph : No Cervical lymphadenopathy, no lichenification or skin changes of chronic lymphedema.       ASSESSMENT AND PLAN:  1. Acute nonintractable headache, unspecified headache type Based upon the  patient's description of symptoms temporal arteritis is a possible differential diagnosis.  The patient's ESR and CRP rates are not greatly elevated as typically seen within patients with temporal arteritis.  However due to the fact that temporal arteritis is a potentially debilitating disease, and the low risk of the surgery it was felt that it was in the patient's best interest to proceed with the procedure.  The procedure, risks, benefits and alternatives were discussed with the patient.  Patient agrees to undergo a right temporal artery biopsy.  The patient will follow-up in office  following the biopsy.  2. Mixed hyperlipidemia Continue statin as ordered and reviewed, no changes at this time   3. Gastroesophageal reflux disease, esophagitis presence not specified Continue PPI as already ordered, this medication has been reviewed and there are no changes at this time.  Avoidence of caffeine and alcohol  Moderate elevation of the head of the bed   4. Stenosis of carotid artery, unspecified laterality  Ultrasound was done and it revealed minimal stenosis, 1 to 39% bilaterally per the patient.   Current Outpatient Medications on File Prior to Visit  Medication Sig Dispense Refill  . amLODipine (NORVASC) 10 MG tablet TAKE 1 TABLET DAILY 90 tablet 4  . aspirin EC 81 MG tablet Take 81 mg by mouth daily.    Marland Kitchen atorvastatin (LIPITOR) 40 MG tablet Take 1 tablet (40 mg total) by mouth daily. 90 tablet 1  . lamoTRIgine (LAMICTAL) 200 MG tablet TAKE ONE-HALF (1/2) TABLET DAILY WITH SUPPER 45 tablet 4  . mirtazapine (REMERON) 7.5 MG tablet TAKE 1 TABLET AT BEDTIME 90 tablet 4  . Multiple Vitamins-Minerals (CENTRUM SILVER PO) Take by mouth.    Marland Kitchen omeprazole (PRILOSEC) 20 MG capsule Take 2 capsules (40 mg total) by mouth daily. 28 capsule 0  . oxybutynin (DITROPAN-XL) 5 MG 24 hr tablet TAKE 1 TABLET AT BEDTIME 90 tablet 4  . ezetimibe (ZETIA) 10 MG tablet Take 1 tablet (10 mg total) by mouth daily.  90 tablet 3  . guaiFENesin (MUCINEX) 600 MG 12 hr tablet Take 1 tablet (600 mg total) by mouth 2 (two) times daily. 20 tablet 1  . levofloxacin (LEVAQUIN) 500 MG tablet Take 1 tablet (500 mg total) by mouth daily. 7 tablet 0   No current facility-administered medications on file prior to visit.     There are no Patient Instructions on file for this visit. No follow-ups on file.   Kris Hartmann, NP  This note was completed with Sales executive.  Any errors are purely unintentional.

## 2018-09-23 NOTE — Telephone Encounter (Signed)
Patient called to cancel his surgery that is scheduled for 09/28/2018 with Dr. Delana Meyer. Patient stated he sent Dr. Caryl Bis the reason why he canceled the surgery. Patient's surgery, pre-op and Covid testing has been canceled ( message left at pre-admission ). Spoke with Wells Guiles in [place].

## 2018-09-26 ENCOUNTER — Encounter (INDEPENDENT_AMBULATORY_CARE_PROVIDER_SITE_OTHER): Payer: Self-pay

## 2018-09-26 ENCOUNTER — Encounter: Payer: Self-pay | Admitting: Family Medicine

## 2018-09-26 NOTE — Telephone Encounter (Signed)
Responded to other mychart message.

## 2018-09-26 NOTE — Telephone Encounter (Signed)
Patient requesting a follow up call regarding PCP thoughts regarding 9/11 and 9/14 My Chart message best # [phone] mobile

## 2018-09-28 ENCOUNTER — Ambulatory Visit: Admission: RE | Admit: 2018-09-28 | Payer: Medicare Other | Source: Home / Self Care | Admitting: Vascular Surgery

## 2018-09-28 ENCOUNTER — Encounter: Payer: Self-pay | Admitting: Family Medicine

## 2018-09-28 ENCOUNTER — Encounter: Admission: RE | Payer: Self-pay | Source: Home / Self Care

## 2018-09-28 SURGERY — BIOPSY TEMPORAL ARTERY
Anesthesia: General | Laterality: Right

## 2018-09-28 MED ORDER — PREDNISONE 50 MG PO TABS: 50.0000 mg | ORAL_TABLET | Freq: Every day | ORAL | 0 refills | Status: DC

## 2018-09-29 ENCOUNTER — Encounter: Payer: Self-pay | Admitting: Family Medicine

## 2018-09-29 DIAGNOSIS — G4489 Other headache syndrome: Secondary | ICD-10-CM

## 2018-09-30 ENCOUNTER — Telehealth: Payer: Self-pay | Admitting: Family Medicine

## 2018-09-30 NOTE — Telephone Encounter (Signed)
Patient is calling to MRI referral Please advise [phone]

## 2018-10-03 ENCOUNTER — Encounter (INDEPENDENT_AMBULATORY_CARE_PROVIDER_SITE_OTHER): Payer: Medicare Other | Admitting: Vascular Surgery

## 2018-10-03 NOTE — Telephone Encounter (Signed)
[facility] for [employer] to advise. Called to let patient know that the MRI has not been scheduled yet, the referral is  Active but he would receive a call for scheduling the MRI from the hospital.  Mailbox was full and I could not leave a message.   Tomi Paddock,cma

## 2018-10-04 ENCOUNTER — Other Ambulatory Visit: Payer: Self-pay | Admitting: Family Medicine

## 2018-10-04 DIAGNOSIS — G4489 Other headache syndrome: Secondary | ICD-10-CM

## 2018-10-05 NOTE — Telephone Encounter (Signed)
I called and spoke to the patient and I informed him that as long as he is not on any medication to relax him for the procedure he would be ok to drive, pt stated he had already spoken to [facility] nad he understood.  Nina,cma

## 2018-10-05 NOTE — Telephone Encounter (Signed)
Patient states he has been scheduled for tomorrow. Patient is requesting a call back from Gae Bon to discuss whether or not he should drive after MRI.

## 2018-10-06 ENCOUNTER — Emergency Department
Admission: EM | Admit: 2018-10-06 | Discharge: 2018-10-06 | Disposition: A | Discharge status: Home / Self Care | Payer: Medicare Other | Attending: Student | Admitting: Student

## 2018-10-06 ENCOUNTER — Emergency Department: Payer: Medicare Other

## 2018-10-06 ENCOUNTER — Telehealth: Payer: Self-pay | Admitting: Internal Medicine

## 2018-10-06 ENCOUNTER — Ambulatory Visit
Admission: RE | Admit: 2018-10-06 | Discharge: 2018-10-06 | Disposition: A | Discharge status: Home / Self Care | Payer: Medicare Other | Source: Ambulatory Visit | Attending: Family Medicine | Admitting: Family Medicine

## 2018-10-06 ENCOUNTER — Other Ambulatory Visit: Payer: Self-pay

## 2018-10-06 ENCOUNTER — Encounter: Payer: Self-pay | Admitting: *Deleted

## 2018-10-06 ENCOUNTER — Encounter: Payer: Self-pay | Admitting: Emergency Medicine

## 2018-10-06 DIAGNOSIS — Z96641 Presence of right artificial hip joint: Secondary | ICD-10-CM | POA: Insufficient documentation

## 2018-10-06 DIAGNOSIS — I6200 Nontraumatic subdural hemorrhage, unspecified: Secondary | ICD-10-CM | POA: Diagnosis not present

## 2018-10-06 DIAGNOSIS — Z7982 Long term (current) use of aspirin: Secondary | ICD-10-CM | POA: Diagnosis not present

## 2018-10-06 DIAGNOSIS — I2510 Atherosclerotic heart disease of native coronary artery without angina pectoris: Secondary | ICD-10-CM | POA: Insufficient documentation

## 2018-10-06 DIAGNOSIS — Z955 Presence of coronary angioplasty implant and graft: Secondary | ICD-10-CM | POA: Diagnosis not present

## 2018-10-06 DIAGNOSIS — R51 Headache: Secondary | ICD-10-CM | POA: Diagnosis not present

## 2018-10-06 DIAGNOSIS — R9389 Abnormal findings on diagnostic imaging of other specified body structures: Secondary | ICD-10-CM | POA: Diagnosis not present

## 2018-10-06 DIAGNOSIS — R930 Abnormal findings on diagnostic imaging of skull and head, not elsewhere classified: Secondary | ICD-10-CM | POA: Diagnosis not present

## 2018-10-06 DIAGNOSIS — Z79899 Other long term (current) drug therapy: Secondary | ICD-10-CM | POA: Insufficient documentation

## 2018-10-06 DIAGNOSIS — I10 Essential (primary) hypertension: Secondary | ICD-10-CM | POA: Diagnosis not present

## 2018-10-06 DIAGNOSIS — S065XAA Traumatic subdural hemorrhage with loss of consciousness status unknown, initial encounter: Secondary | ICD-10-CM

## 2018-10-06 DIAGNOSIS — S065X9A Traumatic subdural hemorrhage with loss of consciousness of unspecified duration, initial encounter: Secondary | ICD-10-CM

## 2018-10-06 DIAGNOSIS — G4489 Other headache syndrome: Secondary | ICD-10-CM | POA: Insufficient documentation

## 2018-10-06 LAB — POCT I-STAT CREATININE: Creatinine, Ser: 1.3 mg/dL — ABNORMAL HIGH (ref 0.61–1.24)

## 2018-10-06 MED ORDER — GADOBUTROL 1 MMOL/ML IV SOLN
7.5000 mL | Freq: Once | INTRAVENOUS | Status: AC | PRN
  Administered 2018-10-06: 7.5 mL via INTRAVENOUS

## 2018-10-06 NOTE — Discharge Instructions (Addendum)
Thank you for letting us take care of you in the emergency department today.   Please follow up with: - The Neurosurgery doctors in clinic. They should call you to schedule this appointment.  If you do not hear from them in the next few days, please call to confirm your appointment. - Your primary care doctor to review your ER visit and follow up on your symptoms.   The Neurosurgery doctors recommend discontinuing your baby aspirin until they see you in clinic. Please call your cardiologist to discuss this.  Please return to the ER for any new or worsening symptoms, including worsening headaches, vision changes, weakness, numbness, tingling, passing out, new falls, or any other signs/symptoms that are concerning to you.

## 2018-10-06 NOTE — ED Notes (Signed)
Portable computer is not working at this time to obtain patient's signature.  Judson Roch, RN discussed discharge instructions with patient. Patient denies any questions regarding discharge instructions at this time.

## 2018-10-06 NOTE — Telephone Encounter (Signed)
Please call the patient and make sure he was advised to go to the ED for evaluation. His MRI revealed a subdural hematoma and he needs to be evaluated in the ED for this. Preferably he would have someone else transport him to the ED. Please then call [facility] ED and advise the charge nurse that he is coming for evaluation after subdural hematoma found on MRI related to history of new onset headaches over the past 1-2 months.  Thanks.

## 2018-10-06 NOTE — ED Provider Notes (Signed)
[facility] Emergency Department Provider Note  ____________________________________________   First MD Initiated Contact with Patient 10/06/18 1323     (approximate)  I have reviewed the triage vital signs and the nursing notes.  History  Chief Complaint Near Syncope and Dizziness    HPI Shane Acevedo is a 85 y.o. male with history of CAD, HTN, HLD, who presents to the ED for abnormal MRI.  Patient states that he has been experiencing intermittent headaches and positional dizziness for the last month or so.  He is currently on steroids for treatment of potential temporal arteritis.  MRI was obtained for further evaluation, which revealed a subdural hematoma.  Impression as below.  He denies any trauma or falls.  He denies any headache or symptoms currently.  He denies any weakness, numbness, tingling.  He is not on any anticoagulation or antiplatelets.  MRI: IMPRESSION: 1. Right cerebral convexity subdural hematoma measuring up to 6 mm in thickness. No significant mass effect. 2. Otherwise unremarkable appearance of the brain for age.    Past Medical Hx Past Medical History:  Diagnosis Date  . Allergy   . Anxiety   . Depression   . Heart disease   . Hypertension     Problem List Patient Active Problem List   Diagnosis Date Noted  . Acute nonintractable headache 09/20/2018  . GERD (gastroesophageal reflux disease) 04/29/2018  . 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 Abdominal aortic atherosclerosis ([place]) 02/15/2017  . Cardiac murmur 01/28/2017  . Carotid artery stenosis 01/28/2017  . Left lower quadrant pain 01/28/2017  . Benign prostatic hyperplasia with nocturia 01/22/2017  . History of compression fracture of spine 01/22/2017  . Hypertension 01/22/2017  . Hyperlipidemia 11/26/2016  . Smoking history 11/26/2016  . Actinic keratoses 07/04/2016  . Vision changes 07/04/2016  . Anxiety and depression 07/04/2016  . Coronary artery  disease of native artery of native heart with stable angina pectoris ([place]) 07/04/2016  . Difficulty swallowing pills 07/04/2016  . OSA (obstructive sleep apnea) 07/04/2016  . Frequent urination 07/04/2016    Past Surgical Hx Past Surgical History:  Procedure Laterality Date  . CORONARY STENT PLACEMENT Right   . TONSILLECTOMY    . TOTAL HIP ARTHROPLASTY Right     Medications Prior to Admission medications   Medication Sig Start Date End Date Taking? Authorizing Provider  amLODipine (NORVASC) 10 MG tablet TAKE 1 TABLET DAILY 02/07/18   Leone Haven, MD  aspirin EC 81 MG tablet Take 81 mg by mouth daily.    [provider]  atorvastatin (LIPITOR) 40 MG tablet Take 1 tablet (40 mg total) by mouth daily. 03/28/18   Jodelle Green, FNP  ezetimibe (ZETIA) 10 MG tablet Take 1 tablet (10 mg total) by mouth daily. 12/28/17 03/28/18  Minna Merritts, MD  guaiFENesin (MUCINEX) 600 MG 12 hr tablet Take 1 tablet (600 mg total) by mouth 2 (two) times daily. 03/28/18   Guse, Jacquelynn Cree, FNP  lamoTRIgine (LAMICTAL) 200 MG tablet TAKE ONE-HALF (1/2) TABLET DAILY WITH SUPPER 03/08/18   Ursula Alert, MD  levofloxacin (LEVAQUIN) 500 MG tablet Take 1 tablet (500 mg total) by mouth daily. 04/05/18   Jodelle Green, FNP  mirtazapine (REMERON) 7.5 MG tablet TAKE 1 TABLET AT BEDTIME 03/08/18   Ursula Alert, MD  Multiple Vitamins-Minerals (CENTRUM SILVER PO) Take by mouth.    [provider]  omeprazole (PRILOSEC) 20 MG capsule Take 2 capsules (40 mg total) by mouth daily. 04/12/18  Leone Haven, MD  oxybutynin (DITROPAN-XL) 5 MG 24 hr tablet TAKE 1 TABLET AT BEDTIME 11/16/17   Leone Haven, MD  predniSONE (DELTASONE) 50 MG tablet Take 1 tablet (50 mg total) by mouth daily with breakfast. 09/28/18   Leone Haven, MD    Allergies Patient has no known allergies.  Family Hx Family History  Problem Relation Age of Onset  . Alcoholism Other   . Arthritis Other   . Lung  cancer Other   . Mental illness Other   . Emphysema Mother   . Bipolar disorder Father   . Coronary artery disease Sister   . Schizophrenia Sister     Social Hx Social History   Tobacco Use  . Smoking status: Never Smoker  . Smokeless tobacco: Never Used  Substance Use Topics  . Alcohol use: Yes    Alcohol/week: 4.0 standard drinks    Types: 2 Glasses of wine, 2 Cans of beer per week  . Drug use: No     Review of Systems  Constitutional: Negative for fever, chills. Eyes: Negative for visual changes. ENT: Negative for sore throat. Cardiovascular: Negative for chest pain. Respiratory: Negative for shortness of breath. Gastrointestinal: Negative for nausea, vomiting.  Genitourinary: Negative for dysuria. Musculoskeletal: Negative for leg swelling. Skin: Negative for rash. Neurological: + prior headaches, dizziness, none currently   Physical Exam  Vital Signs: ED Triage Vitals  Enc Vitals Group     BP 10/06/18 1322 (!) 146/70     Pulse Rate 10/06/18 1322 (!) 56     Resp 10/06/18 1322 14     Temp 10/06/18 1322 98.1 F (36.7 C)     Temp Source 10/06/18 1322 Oral     SpO2 10/06/18 1322 98 %     Weight 10/06/18 1318 174 lb 9.7 oz (79.2 kg)     Height 10/06/18 1318 5\' 8"  (1.727 m)     Head Circumference --      Peak Flow --      Pain Score 10/06/18 1317 0     Pain Loc --      Pain Edu? --      Excl. in [place]? --     Constitutional: Alert and oriented.  Head: Normocephalic. Atraumatic. Eyes: Conjunctivae clear. Sclera anicteric. Nose: No congestion. No rhinorrhea. Mouth/Throat: Mucous membranes are moist.  Neck: No stridor.   Cardiovascular: Normal rate, regular rhythm. No murmurs. Extremities well perfused. Respiratory: Normal respiratory effort.  Lungs CTAB. Gastrointestinal: Soft. Non-tender. Non-distended.  Musculoskeletal: No lower extremity edema. No deformities. Neurologic:  Normal speech and language. No gross focal neurologic deficits are appreciated.  Alert and oriented.  Face symmetric.  Tongue midline.  Cranial nerves II through XII intact. UE and LE strength 5/5 and symmetric. UE and LE SILT.  Skin: Skin is warm, dry and intact. No rash noted. Psychiatric: Mood and affect are appropriate for situation.  EKG  N/A    Radiology  MRI: IMPRESSION: 1. Right cerebral convexity subdural hematoma measuring up to 6 mm in thickness. No significant mass effect. 2. Otherwise unremarkable appearance of the brain for age.  CT: IMPRESSION: 1. Right-sided subdural hemorrhage as noted on the MRI of the brain obtained earlier today. It is hyperattenuating and relatively uniform in density suggesting that it is relatively recent in chronicity. 2. Age-appropriate volume loss. Mild chronic microvascular ischemic change.     Procedures  Procedure(s) performed (including critical care):  .Critical Care Performed by: Lilia Pro., MD Authorized by: Joan Mayans,  Damaris Hippo., MD   Critical care provider statement:    Critical care time (minutes):  45   Critical care was necessary to treat or prevent imminent or life-threatening deterioration of the following conditions:  CNS failure or compromise   Critical care was time spent personally by me on the following activities:  Discussions with consultants, evaluation of patient's response to treatment, examination of patient, ordering and performing treatments and interventions, ordering and review of laboratory studies, ordering and review of radiographic studies, pulse oximetry, re-evaluation of patient's condition, obtaining history from patient or surrogate and review of old charts     Initial Impression / Assessment and Plan / ED Course  85 y.o. male who presents to the ED for abnormal MRI, as above.  He has no complaints at this time, and is neurologically intact.  No trauma, falls, or anti-coagulation.  Discussed results with neurosurgery, who suspects these findings are subacute.  We will  plan for repeat head CT at 5 PM. If stable, may consider discharge.  Plan: repeat head CT at 5 PM   Repeat head CT is stable.  Patient remains neurologically intact without any symptoms.  Discussed with Neurosurgery.  Given this, patient is reasonable for discharge, they will follow-up in clinic.  Discussed plan of care with the patient, who voices understanding and is comfortable with the plan and discharge.  Given return precautions.  Final Clinical Impression(s) / ED Diagnosis  Final diagnoses:  Abnormal MRI  Subdural hematoma ([place])       Note:  This document was prepared using Dragon voice recognition software and may include unintentional dictation errors.   Lilia Pro., MD 10/06/18 [phone]

## 2018-10-06 NOTE — Telephone Encounter (Signed)
Per Gae Bon, pt already in ED

## 2018-10-06 NOTE — ED Notes (Signed)
Patient transported to CT at this time. 

## 2018-10-06 NOTE — Telephone Encounter (Signed)
Call GI radiology 6 mm SDH right having h/a x 2 months no mass effect  rec pt go to ED [facility]  Will CC PCP inform patient

## 2018-10-06 NOTE — ED Triage Notes (Signed)
Had mri today for sx for couple months--some dizziness, some headaches(sharp pain)  Flushing with he nods head back and forth.  Has been taking prednisone .  Came from [facility] for possible bleed.

## 2018-10-07 ENCOUNTER — Other Ambulatory Visit: Payer: Self-pay | Admitting: Neurosurgery

## 2018-10-07 DIAGNOSIS — S065XAA Traumatic subdural hemorrhage with loss of consciousness status unknown, initial encounter: Secondary | ICD-10-CM

## 2018-10-07 DIAGNOSIS — S065X9A Traumatic subdural hemorrhage with loss of consciousness of unspecified duration, initial encounter: Secondary | ICD-10-CM

## 2018-10-09 NOTE — Telephone Encounter (Signed)
He can come off asa, but Certainly a risk coming off given known CAD, prior stent Would suggest he confirm with neurosurgery if he  Needs to come off and for how long thx TGollan

## 2018-10-12 ENCOUNTER — Telehealth: Payer: Self-pay | Admitting: Family Medicine

## 2018-10-12 ENCOUNTER — Telehealth: Payer: Self-pay

## 2018-10-12 MED ORDER — PREDNISONE 20 MG PO TABS: 40.0000 mg | ORAL_TABLET | Freq: Every day | ORAL | 0 refills | Status: DC

## 2018-10-12 NOTE — Telephone Encounter (Signed)
Please call the patient and let him know I sent in prednisone for him to continue on. I am going to reach out to the neurosurgeons office to see if they feel he needs to remain on this. We will let the patient know what they say. He will need to taper off the prednisone slowly and I have sent in a slightly lower dose for him to start on. Thanks.

## 2018-10-12 NOTE — Telephone Encounter (Signed)
Copied from [place] [phone]. Topic: General - Other >> Oct 12, 2018  3:00 PM Rainey Pines A wrote: Patient returning call from nurse in regards to [employer]

## 2018-10-13 NOTE — Telephone Encounter (Signed)
No great data for a preferred taper regimen is appropriate. Unless neurosurgeon has any other experience-based regimens, I'd continue the same thing - 40 mg x 2 weeks, then 30 mg x 2 weeks, then 20 mg x2 weeks, then 10 mg x 2 weeks, then 5 mg x 2 weeks then stop.

## 2018-10-18 ENCOUNTER — Telehealth: Payer: Self-pay

## 2018-10-18 ENCOUNTER — Encounter: Payer: Self-pay | Admitting: Family Medicine

## 2018-10-18 NOTE — Telephone Encounter (Signed)
Returned call to patient. Patient reports he had MRI done due to headaches, and has subdural hematoma. He does not want to take any lamictal or remeron now is sleeping ok and hence stopped all meds.  Discussed to monitor mood closely.  Call if he needs to be back on medications.

## 2018-10-18 NOTE — Telephone Encounter (Signed)
pt called left a message wants to speak with doctor about everything that is going on.  pt states he have to go have multiple mri done and that some of the medication other doctors taken him office of. her wants to you

## 2018-10-18 NOTE — Telephone Encounter (Signed)
Called to speak with patient and the mailbox is full and can't receive messages.  [place] for [employer] to advise.  Nina,cma

## 2018-10-18 NOTE — Telephone Encounter (Signed)
Please let the patient know that I heard back from the neurosurgeon regarding his steroids. They noted that they will sometimes use dexamethasone for 2 weeks, though there is not good evidence for steroid use past 2 weeks. Given this we will proceed with the taper on his prednisone. The patient is completing a visit tomorrow and I can send the completed taper in for him at that time. Thanks.

## 2018-10-19 ENCOUNTER — Ambulatory Visit (INDEPENDENT_AMBULATORY_CARE_PROVIDER_SITE_OTHER): Payer: Medicare Other | Admitting: Family Medicine

## 2018-10-19 ENCOUNTER — Other Ambulatory Visit: Payer: Self-pay

## 2018-10-19 ENCOUNTER — Ambulatory Visit (INDEPENDENT_AMBULATORY_CARE_PROVIDER_SITE_OTHER): Payer: Medicare Other

## 2018-10-19 ENCOUNTER — Encounter: Payer: Self-pay | Admitting: Family Medicine

## 2018-10-19 DIAGNOSIS — S065X9A Traumatic subdural hemorrhage with loss of consciousness of unspecified duration, initial encounter: Secondary | ICD-10-CM

## 2018-10-19 DIAGNOSIS — S065XAA Traumatic subdural hemorrhage with loss of consciousness status unknown, initial encounter: Secondary | ICD-10-CM | POA: Insufficient documentation

## 2018-10-19 DIAGNOSIS — E782 Mixed hyperlipidemia: Secondary | ICD-10-CM

## 2018-10-19 DIAGNOSIS — Z0000 Encounter for general adult medical examination without abnormal findings: Secondary | ICD-10-CM

## 2018-10-19 HISTORY — DX: Traumatic subdural hemorrhage with loss of consciousness status unknown, initial encounter: S06.5XAA

## 2018-10-19 HISTORY — DX: Traumatic subdural hemorrhage with loss of consciousness of unspecified duration, initial encounter: S06.5X9A

## 2018-10-19 MED ORDER — PREDNISONE 10 MG PO TABS: ORAL_TABLET | ORAL | 0 refills | Status: AC

## 2018-10-19 NOTE — Telephone Encounter (Signed)
Visit completed with patient today.

## 2018-10-19 NOTE — Progress Notes (Signed)
Virtual Visit via telephone Note  This visit type was conducted due to national recommendations for restrictions regarding the COVID-19 pandemic (e.g. social distancing).  This format is felt to be most appropriate for this patient at this time.  All issues noted in this document were discussed and addressed.  No physical exam was performed (except for noted visual exam findings with Video Visits).   I connected with Shane Acevedo today at  9:30 AM EDT by telephone and verified that I am speaking with the correct person using two identifiers. Location patient: home Location provider: work  Persons participating in the virtual visit: patient, provider  I discussed the limitations, risks, security and privacy concerns of performing an evaluation and management service by telephone and the availability of in person appointments. I also discussed with the patient that there may be a patient responsible charge related to this service. The patient expressed understanding and agreed to proceed.  Interactive audio and video telecommunications were attempted between this provider and patient, however failed, due to patient having technical difficulties OR patient did not have access to video capability.  We continued and completed visit with audio only.   Reason for visit: follow-up  HPI: Subdural hematoma: Patient reports he is doing overall quite well.  His headache varies off and on just as it has over the last 2 months.  If he nods or shakes his head a little bit he will have a slight discomfort in his right temple and feels a little slushy.  He has not taken the prednisone for several days as he did not go to the pharmacy to pick up the 40 mg dosing.  He notes the right temple is a little sore at times.  He wonders if he could have temporal arteritis in addition to the subdural hematoma.  He has been a little more lightheaded today though it has improved as the day has gone on.  No numbness, weakness,  or vision changes.  Blood pressure most recently was 128/65 and pulse 60-63.  He has an appointment with neurosurgery in about 2 weeks.  He has a repeat CT scan scheduled for prior to that.  Hyperlipidemia: Most recent LDL was 67.  He is tolerating Lipitor well.   ROS: See pertinent positives and negatives per HPI.  Past Medical History:  Diagnosis Date  . Allergy   . Anxiety   . Depression   . Heart disease   . Hypertension     Past Surgical History:  Procedure Laterality Date  . CORONARY STENT PLACEMENT Right   . TONSILLECTOMY    . TOTAL HIP ARTHROPLASTY Right     Family History  Problem Relation Age of Onset  . Alcoholism Other   . Arthritis Other   . Lung cancer Other   . Mental illness Other   . Emphysema Mother   . Bipolar disorder Father   . Coronary artery disease Sister   . Schizophrenia Sister     SOCIAL HX: Non-smoker.   Current Outpatient Medications:  .  amLODipine (NORVASC) 10 MG tablet, TAKE 1 TABLET DAILY, Disp: 90 tablet, Rfl: 4 .  aspirin EC 81 MG tablet, Take 81 mg by mouth daily., Disp: , Rfl:  .  atorvastatin (LIPITOR) 40 MG tablet, Take 1 tablet (40 mg total) by mouth daily., Disp: 90 tablet, Rfl: 1 .  Cholecalciferol (VITAMIN D3 PO), Take 1,000 Units by mouth., Disp: , Rfl:  .  ezetimibe (ZETIA) 10 MG tablet, Take 1 tablet (10  mg total) by mouth daily., Disp: 90 tablet, Rfl: 3 .  Multiple Vitamins-Minerals (CENTRUM SILVER PO), Take by mouth., Disp: , Rfl:  .  omeprazole (PRILOSEC) 20 MG capsule, Take 2 capsules (40 mg total) by mouth daily., Disp: 28 capsule, Rfl: 0 .  oxybutynin (DITROPAN-XL) 5 MG 24 hr tablet, TAKE 1 TABLET AT BEDTIME, Disp: 90 tablet, Rfl: 4 .  predniSONE (DELTASONE) 10 MG tablet, Take 3 tablets (30 mg total) by mouth daily with breakfast for 14 days, THEN 2 tablets (20 mg total) daily with breakfast for 14 days, THEN 1 tablet (10 mg total) daily with breakfast for 14 days, THEN 0.5 tablets (5 mg total) daily with breakfast for  14 days. Start after completing your 40 mg dosing of prednisone.., Disp: 91 tablet, Rfl: 0  EXAM: This is a telehealth telephone visit and thus no physical exam was completed.  ASSESSMENT AND PLAN:  Discussed the following assessment and plan:  Subdural hematoma ([place]) Patient remained stable.  He has not developed any new symptoms.  I advised him against shaking his head.  I did discuss that there is always a possibility he could have temporal arteritis on top of the subdural hematoma though to know that for sure he would have to have a biopsy completed.  He would like to see the neurosurgeon first and get their input prior to considering biopsy.  We can consider referral back to vascular surgery after he sees the neurosurgeon.  I did advise that I suspect his symptoms are related to the subdural hematoma and that I am not convinced it is related to temporal arteritis at this time.  I would have expected his symptoms to improve quite a bit more with the prednisone if it was related to temporal arteritis.  I discussed the importance of tapering off of the prednisone given how long he has been on it.  I advised him the risk of adrenal insufficiency if he were to discontinue the prednisone suddenly.  I advised him not to discontinued all of a sudden.  He noted he would go to the pharmacy to pick up the 40 mg dosing.  I sent a further taper in for him as outlined by our clinical pharmacist.  I advised him of reasons to seek medical attention in the emergency room.  Hyperlipidemia At goal on last check.  We will plan on checking labs at his follow-up visit in about a month.    I discussed the assessment and treatment plan with the patient. The patient was provided an opportunity to ask questions and all were answered. The patient agreed with the plan and demonstrated an understanding of the instructions.   The patient was advised to call back or seek an in-person evaluation if the symptoms worsen or if  the condition fails to improve as anticipated.  I provided 18 minutes of non-face-to-face time during this encounter.   [name], MD

## 2018-10-19 NOTE — Assessment & Plan Note (Signed)
At goal on last check.  We will plan on checking labs at his follow-up visit in about a month.

## 2018-10-19 NOTE — Assessment & Plan Note (Signed)
Patient remained stable.  He has not developed any new symptoms.  I advised him against shaking his head.  I did discuss that there is always a possibility he could have temporal arteritis on top of the subdural hematoma though to know that for sure he would have to have a biopsy completed.  He would like to see the neurosurgeon first and get their input prior to considering biopsy.  We can consider referral back to vascular surgery after he sees the neurosurgeon.  I did advise that I suspect his symptoms are related to the subdural hematoma and that I am not convinced it is related to temporal arteritis at this time.  I would have expected his symptoms to improve quite a bit more with the prednisone if it was related to temporal arteritis.  I discussed the importance of tapering off of the prednisone given how long he has been on it.  I advised him the risk of adrenal insufficiency if he were to discontinue the prednisone suddenly.  I advised him not to discontinued all of a sudden.  He noted he would go to the pharmacy to pick up the 40 mg dosing.  I sent a further taper in for him as outlined by our clinical pharmacist.  I advised him of reasons to seek medical attention in the emergency room.

## 2018-10-19 NOTE — Telephone Encounter (Signed)
Message was responded by provider at phone visit.  Diondra Pines,cma

## 2018-10-19 NOTE — Progress Notes (Signed)
I have reviewed the above note and agree.  Eric Sonnenberg, M.D.  

## 2018-10-19 NOTE — Progress Notes (Signed)
Subjective:   Antone Villaruz is a 85 y.o. male who presents for Medicare Annual/Subsequent preventive examination.  Review of Systems:  No ROS.  Medicare Wellness Virtual Visit.  Visual/audio telehealth visit, UTA vital signs.   See social history for additional risk factors.   Cardiac Risk Factors include: advanced age (>86men, >77 women);male gender;hypertension     Objective:    Vitals: There were no vitals taken for this visit.  There is no height or weight on file to calculate BMI.  Advanced Directives 10/19/2018 10/06/2018 10/15/2017  Does Patient Have a Medical Advance Directive? Yes Yes Yes  Type of Advance Directive Living will;Healthcare Power of [place];Living will  Does patient want to make changes to medical advance directive? No - Patient declined - No - Patient declined  Copy of [place] in Chart? - - No - copy requested  Some encounter information is confidential and restricted. Go to Review Flowsheets activity to see all data.    Tobacco Social History   Tobacco Use  Smoking Status Never Smoker  Smokeless Tobacco Never Used     Counseling given: Not Answered   Clinical Intake:  Pre-visit preparation completed: Yes        Diabetes: No  How often do you need to have someone help you when you read instructions, pamphlets, or other written materials from your doctor or pharmacy?: 1 - Never  Interpreter Needed?: No     Past Medical History:  Diagnosis Date  . Allergy   . Anxiety   . Depression   . Heart disease   . Hypertension    Past Surgical History:  Procedure Laterality Date  . CORONARY STENT PLACEMENT Right   . TONSILLECTOMY    . TOTAL HIP ARTHROPLASTY Right    Family History  Problem Relation Age of Onset  . Alcoholism Other   . Arthritis Other   . Lung cancer Other   . Mental illness Other   . Emphysema Mother   . Bipolar disorder Father   . Coronary artery disease  Sister   . Schizophrenia Sister    Social History   Socioeconomic History  . Marital status: Married    Spouse name: Not on file  . Number of children: Not on file  . Years of education: Not on file  . Highest education level: Not on file  Occupational History  . Not on file  Social Needs  . Financial resource strain: Not hard at all  . Food insecurity    Worry: Never true    Inability: Never true  . Transportation needs    Medical: No    Non-medical: No  Tobacco Use  . Smoking status: Never Smoker  . Smokeless tobacco: Never Used  Substance and Sexual Activity  . Alcohol use: Not Currently  . Drug use: No  . Sexual activity: Not Currently  Lifestyle  . Physical activity    Days per week: 3 days    Minutes per session: 60 min  . Stress: Not at all  Relationships  . Social Herbalist on phone: Not on file    Gets together: Not on file    Attends religious service: Not on file    Active member of club or organization: Not on file    Attends meetings of clubs or organizations: Not on file    Relationship status: Not on file  Other Topics Concern  . Not on file  Social History Narrative  . Not on file    Outpatient Encounter Medications as of 10/19/2018  Medication Sig  . amLODipine (NORVASC) 10 MG tablet TAKE 1 TABLET DAILY  . aspirin EC 81 MG tablet Take 81 mg by mouth daily.  Marland Kitchen atorvastatin (LIPITOR) 40 MG tablet Take 1 tablet (40 mg total) by mouth daily.  . Cholecalciferol (VITAMIN D3 PO) Take 1,000 Units by mouth.  . ezetimibe (ZETIA) 10 MG tablet Take 1 tablet (10 mg total) by mouth daily.  . Multiple Vitamins-Minerals (CENTRUM SILVER PO) Take by mouth.  Marland Kitchen omeprazole (PRILOSEC) 20 MG capsule Take 2 capsules (40 mg total) by mouth daily.  Marland Kitchen oxybutynin (DITROPAN-XL) 5 MG 24 hr tablet TAKE 1 TABLET AT BEDTIME  . predniSONE (DELTASONE) 20 MG tablet Take 2 tablets (40 mg total) by mouth daily with breakfast.  . [DISCONTINUED] ezetimibe (ZETIA) 10 MG  tablet Take by mouth.  . [DISCONTINUED] guaiFENesin (MUCINEX) 600 MG 12 hr tablet Take 1 tablet (600 mg total) by mouth 2 (two) times daily.  . [DISCONTINUED] levofloxacin (LEVAQUIN) 500 MG tablet Take 1 tablet (500 mg total) by mouth daily.   No facility-administered encounter medications on file as of 10/19/2018.     Activities of Daily Living In your present state of health, do you have any difficulty performing the following activities: 10/19/2018  Hearing? N  Vision? N  Difficulty concentrating or making decisions? N  Walking or climbing stairs? N  Dressing or bathing? N  Doing errands, shopping? N  Preparing Food and eating ? N  Using the Toilet? N  In the past six months, have you accidently leaked urine? N  Do you have problems with loss of bowel control? N  Managing your Medications? N  Managing your Finances? N  Housekeeping or managing your Housekeeping? N  Some recent data might be hidden    Patient Care Team: Leone Haven, MD as PCP - General (Family Medicine)   Assessment:   This is a routine wellness examination for Donyae.  I connected with patient 10/19/18 at  9:00 AM EDT by an audio enabled telemedicine application and verified that I am speaking with the correct person using two identifiers. Patient stated full name and DOB. Patient gave permission to continue with virtual visit. Patient's location was at home and Nurse's location was at [place] office.   Health Maintenance Due: -PNA - discussed; to be completed with doctor in visit or local pharmacy.  -Tdap- discussed; to be completed with doctor in visit or local pharmacy.   Update all pending maintenance due as appropriate.   See completed HM at the end of note.   Eye: Visual acuity not assessed. Virtual visit. Wears corrective lenses.   Dental: UTD  Hearing: Demonstrates normal hearing during visit.  Safety:  Patient feels safe at home- yes Patient does have smoke detectors at home- yes  Patient does wear sunscreen or protective clothing when in direct sunlight - yes Patient does wear seat belt when in a moving vehicle - yes Patient drives- yes Adequate lighting in walkways free from debris- yes Grab bars and handrails used as appropriate- yes Ambulates with a walking stick as an assistive device Lifeline/life alert/medic alert on person when ambulating outside of the home- yes  Social: Alcohol intake - not currently    Smoking history- never   Smokers in home? none Illicit drug use? none  Depression: PHQ 2 &9 complete. See screening below. Denies irritability, anhedonia, sadness/tearfullness.  Followed by Dr.  Eappen for counseling every 6-9 months.  Falls: See screening below.    Medication: Taking as directed and without issues.   Covid-19: Precautions and sickness symptoms discussed. Wears mask, social distancing, hand hygiene as appropriate.   Activities of Daily Living Patient denies needing assistance with: household chores, feeding themselves, getting from bed to chair, getting to the toilet, bathing/showering, dressing, managing money, or preparing meals.   Memory: Patient is alert. Patient denies difficulty focusing or concentrating. Correctly identified the president of the [place], season and recall. Patient likes to cook and read for brain stimulation.  BMI- discussed the importance of a healthy diet, water intake and the benefits of aerobic exercise.  Educational material provided.  Physical activity- swims 50 laps every other day, walking 2 miles about 45 minutes.  Diet: healthy Water: good intake Caffeine: 1-2 cups of coffee  Other Providers Patient Care Team: Leone Haven, MD as PCP - General (Family Medicine)  Exercise Activities and Dietary recommendations Current Exercise Habits: Home exercise routine, Type of exercise: walking(swimming), Time (Minutes): 60, Frequency (Times/Week): 3, Weekly Exercise (Minutes/Week): 180, Intensity:  Moderate  Goals      Patient Stated   . Low cholesterol diet (pt-stated)      Other   . Increase physical activity     Stay active       Fall Risk Fall Risk  10/19/2018 08/19/2018 10/15/2017 07/19/2017 07/03/2016  Falls in the past year? 0 0 No No No  Number falls in past yr: - 0 - - -  Injury with Fall? - 0 - - -   Timed Get Up and Go Performed: no, virtual visit  Depression Screen PHQ 2/9 Scores 10/19/2018 08/19/2018 10/15/2017 07/03/2016  PHQ - 2 Score 0 0 0 6  PHQ- 9 Score - 0 - 12    Cognitive Function     6CIT Screen 10/19/2018 10/15/2017  What Year? 0 points 0 points  What month? 0 points 0 points  What time? 0 points 0 points  Count back from 20 0 points 0 points  Months in reverse 0 points 0 points  Repeat phrase 0 points 0 points  Total Score 0 0    Immunization History  Administered Date(s) Administered  . Fluad Quad(high Dose 65+) 09/20/2018  . Influenza, High Dose Seasonal PF 10/05/2016, 10/15/2017   Screening Tests Health Maintenance  Topic Date Due  . TETANUS/TDAP  10/04/1954  . PNA vac Low Risk Adult (1 of 2 - PCV13) 10/03/2000  . INFLUENZA VACCINE  Completed      Plan:   Keep all routine maintenance appointments.   Next scheduled follow up today @ 930.  Follow up scheduled 11/18/18    Medicare Attestation I have personally reviewed: The patient's medical and social history Their use of alcohol, tobacco or illicit drugs Their current medications and supplements The patient's functional ability including ADLs,fall risks, home safety risks, cognitive, and hearing and visual impairment Diet and physical activities Evidence for depression   In addition, I have reviewed and discussed with patient certain preventive protocols, quality metrics, and best practice recommendations. A written personalized care plan for preventive services as well as general preventive health recommendations were provided to patient via mail.     Varney Biles,  LPN  [id number]

## 2018-10-19 NOTE — Patient Instructions (Addendum)
  Mr. Shane Acevedo , Thank you for taking time to come for your Medicare Wellness Visit. I appreciate your ongoing commitment to your health goals. Please review the following plan we discussed and let me know if I can assist you in the future.   These are the goals we discussed: Goals      Patient Stated   . Low cholesterol diet (pt-stated)      Other   . Increase physical activity     Stay active       This is a list of the screening recommended for you and due dates:  Health Maintenance  Topic Date Due  . Tetanus Vaccine  10/04/1954  . Pneumonia vaccines (1 of 2 - PCV13) 10/03/2000  . Flu Shot  Completed

## 2018-10-23 ENCOUNTER — Encounter: Payer: Self-pay | Admitting: Family Medicine

## 2018-10-23 DIAGNOSIS — E785 Hyperlipidemia, unspecified: Secondary | ICD-10-CM

## 2018-10-24 MED ORDER — ATORVASTATIN CALCIUM 40 MG PO TABS: 40.0000 mg | ORAL_TABLET | Freq: Every day | ORAL | 1 refills | Status: DC

## 2018-10-31 ENCOUNTER — Ambulatory Visit
Admission: RE | Admit: 2018-10-31 | Discharge: 2018-10-31 | Disposition: A | Discharge status: Home / Self Care | Payer: Medicare Other | Source: Ambulatory Visit | Attending: Neurosurgery | Admitting: Neurosurgery

## 2018-10-31 ENCOUNTER — Other Ambulatory Visit: Payer: Self-pay

## 2018-10-31 DIAGNOSIS — S065X9A Traumatic subdural hemorrhage with loss of consciousness of unspecified duration, initial encounter: Secondary | ICD-10-CM | POA: Insufficient documentation

## 2018-10-31 DIAGNOSIS — I6200 Nontraumatic subdural hemorrhage, unspecified: Secondary | ICD-10-CM | POA: Diagnosis not present

## 2018-10-31 DIAGNOSIS — S065XAA Traumatic subdural hemorrhage with loss of consciousness status unknown, initial encounter: Secondary | ICD-10-CM

## 2018-11-03 DIAGNOSIS — S065X9A Traumatic subdural hemorrhage with loss of consciousness of unspecified duration, initial encounter: Secondary | ICD-10-CM | POA: Diagnosis not present

## 2018-11-04 ENCOUNTER — Encounter: Payer: Self-pay | Admitting: Family Medicine

## 2018-11-04 DIAGNOSIS — S065XAA Traumatic subdural hemorrhage with loss of consciousness status unknown, initial encounter: Secondary | ICD-10-CM

## 2018-11-04 DIAGNOSIS — S065X9A Traumatic subdural hemorrhage with loss of consciousness of unspecified duration, initial encounter: Secondary | ICD-10-CM

## 2018-11-04 DIAGNOSIS — R519 Headache, unspecified: Secondary | ICD-10-CM

## 2018-11-04 DIAGNOSIS — G8929 Other chronic pain: Secondary | ICD-10-CM

## 2018-11-07 ENCOUNTER — Telehealth: Payer: Self-pay

## 2018-11-07 NOTE — Telephone Encounter (Signed)
Copied from [id number] [id number]. Topic: General - Inquiry >> Nov 07, 2018  4:18 PM Shane Acevedo wrote: Patient requesting call back from [place] regarding mychart message sent today.

## 2018-11-10 DIAGNOSIS — R519 Headache, unspecified: Secondary | ICD-10-CM | POA: Diagnosis not present

## 2018-11-10 DIAGNOSIS — S065X9A Traumatic subdural hemorrhage with loss of consciousness of unspecified duration, initial encounter: Secondary | ICD-10-CM | POA: Diagnosis not present

## 2018-11-16 ENCOUNTER — Other Ambulatory Visit: Payer: Self-pay

## 2018-11-16 ENCOUNTER — Ambulatory Visit (INDEPENDENT_AMBULATORY_CARE_PROVIDER_SITE_OTHER): Payer: Medicare Other | Admitting: Nurse Practitioner

## 2018-11-16 ENCOUNTER — Encounter (INDEPENDENT_AMBULATORY_CARE_PROVIDER_SITE_OTHER): Payer: Self-pay | Admitting: Nurse Practitioner

## 2018-11-16 VITALS — BP 119/72 | HR 66 | Resp 15 | Wt 170.4 lb

## 2018-11-16 DIAGNOSIS — I10 Essential (primary) hypertension: Secondary | ICD-10-CM

## 2018-11-16 DIAGNOSIS — K219 Gastro-esophageal reflux disease without esophagitis: Secondary | ICD-10-CM

## 2018-11-16 DIAGNOSIS — R519 Headache, unspecified: Secondary | ICD-10-CM

## 2018-11-18 ENCOUNTER — Ambulatory Visit (INDEPENDENT_AMBULATORY_CARE_PROVIDER_SITE_OTHER): Payer: Medicare Other | Admitting: Family Medicine

## 2018-11-18 ENCOUNTER — Encounter: Payer: Self-pay | Admitting: Family Medicine

## 2018-11-18 ENCOUNTER — Other Ambulatory Visit: Payer: Self-pay

## 2018-11-18 ENCOUNTER — Encounter (INDEPENDENT_AMBULATORY_CARE_PROVIDER_SITE_OTHER): Payer: Self-pay

## 2018-11-18 DIAGNOSIS — S065XAA Traumatic subdural hemorrhage with loss of consciousness status unknown, initial encounter: Secondary | ICD-10-CM

## 2018-11-18 DIAGNOSIS — R519 Headache, unspecified: Secondary | ICD-10-CM | POA: Diagnosis not present

## 2018-11-18 DIAGNOSIS — I6529 Occlusion and stenosis of unspecified carotid artery: Secondary | ICD-10-CM | POA: Diagnosis not present

## 2018-11-18 DIAGNOSIS — G4733 Obstructive sleep apnea (adult) (pediatric): Secondary | ICD-10-CM | POA: Diagnosis not present

## 2018-11-18 DIAGNOSIS — I10 Essential (primary) hypertension: Secondary | ICD-10-CM

## 2018-11-18 DIAGNOSIS — S065X9A Traumatic subdural hemorrhage with loss of consciousness of unspecified duration, initial encounter: Secondary | ICD-10-CM | POA: Diagnosis not present

## 2018-11-18 DIAGNOSIS — R0789 Other chest pain: Secondary | ICD-10-CM | POA: Insufficient documentation

## 2018-11-18 NOTE — Patient Instructions (Signed)
Nice to see you. Please keep your appointments with your specialists. We will get a sleep study ordered for you.  This should not be completed until after you have your temporal artery biopsy. Please send [place] a message with the name of your physician from [place].

## 2018-11-18 NOTE — Assessment & Plan Note (Signed)
Symptoms are atypical.  I suspect a musculoskeletal cause.  Symptoms are not consistent with a cardiac cause.  He will monitor.  Given reasons to seek medical attention.

## 2018-11-18 NOTE — Assessment & Plan Note (Signed)
We will refer for home sleep study.  Discussed not having this completed until after his temporal artery biopsy site has healed.

## 2018-11-18 NOTE — Assessment & Plan Note (Signed)
Well-controlled.  Continue current regimen. 

## 2018-11-18 NOTE — Progress Notes (Signed)
Shane Rumps, MD Phone: [phone]  Shane Acevedo is a 85 y.o. male who presents today for follow-up.  Headache/subdural hematoma/possible temporal arteritis: Patient continues to have some headache on the right side.  It has improved to a certain degree.  He saw neurology for the subdural hematoma and they are planning on repeat imaging in December.  He is scheduled for temporal artery biopsy next week.  He misunderstood the instructions on the prednisone taper and is just now on to 30 mg daily.  He does note some difficulty sleeping with that.  Hypertension: Notes it has been well controlled when he has been at other offices.  He is taking amlodipine.  No chest pressure or shortness of breath.  Sharp upper chest discomfort: Patient notes a couple of days ago he had some sharp discomfort near his sternoclavicular joint bilaterally.  Notes it was an intermittent discomfort that went on for about an hour.  It did not radiate.  No diaphoresis.  No shortness of breath.  No hemoptysis.  Resolved on its own.  He has had similar muscle type spasm sensations in his ribs in the past.  Sleep apnea: He wonders about doing a repeat sleep study.  He notes mild sleep apnea in the past.  He does not wake up well rested.  His wife reports apneic episodes.  Social History   Tobacco Use  Smoking Status Never Smoker  Smokeless Tobacco Never Used     ROS see history of present illness  Objective  Physical Exam Vitals:   11/18/18 0854  BP: 110/60  Pulse: (!) 56  Temp: (!) 96.5 F (35.8 C)  SpO2: 99%    BP Readings from Last 3 Encounters:  11/18/18 110/60  11/16/18 119/72  10/06/18 (!) 175/79   Wt Readings from Last 3 Encounters:  11/18/18 171 lb 6.4 oz (77.7 kg)  11/16/18 170 lb 6.4 oz (77.3 kg)  10/19/18 167 lb (75.8 kg)    Physical Exam Constitutional:      General: He is not in acute distress.    Appearance: He is not diaphoretic.  Cardiovascular:     Rate and Rhythm:  Normal rate and regular rhythm.     Heart sounds: Normal heart sounds.  Pulmonary:     Effort: Pulmonary effort is normal.     Breath sounds: Normal breath sounds.  Chest:     Chest wall: No tenderness.  Musculoskeletal:     Right lower leg: No edema.     Left lower leg: No edema.  Skin:    General: Skin is warm and dry.  Neurological:     Mental Status: He is alert.      Assessment/Plan: Please see individual problem list.  Hypertension Well-controlled.  Continue current regimen.  OSA (obstructive sleep apnea) We will refer for home sleep study.  Discussed not having this completed until after his temporal artery biopsy site has healed.  Subdural hematoma (HCC) Headaches have improved slightly.  He will continue to see neurology.  He will seek medical attention if he has any worsening symptoms.  Acute nonintractable headache Patient potentially with temporal arteritis and he is going to undergo temporal artery biopsy next week.  Discussed needing to see a rheumatologist if this was positive as they will manage his prednisone.  If this is negative he will taper down on the prednisone.  Atypical chest pain Symptoms are atypical.  I suspect a musculoskeletal cause.  Symptoms are not consistent with a cardiac cause.  He  will monitor.  Given reasons to seek medical attention.   Health maintenance: Patient will send [place] the name of his prior PCP and we will contact them to see when his pneumonia vaccines were given and his tetanus vaccine was last given.  Orders Placed This Encounter  Procedures  . Home sleep test    Do not complete until after he has healed from temporal artery biopsy    Order Specific Question:   Where should this test be performed:    Answer:   [facility]    No orders of the defined types were placed in this encounter.    Shane Rumps, MD [place]

## 2018-11-18 NOTE — Assessment & Plan Note (Signed)
Headaches have improved slightly.  He will continue to see neurology.  He will seek medical attention if he has any worsening symptoms.

## 2018-11-18 NOTE — Assessment & Plan Note (Signed)
Patient potentially with temporal arteritis and he is going to undergo temporal artery biopsy next week.  Discussed needing to see a rheumatologist if this was positive as they will manage his prednisone.  If this is negative he will taper down on the prednisone.

## 2018-11-19 ENCOUNTER — Encounter (INDEPENDENT_AMBULATORY_CARE_PROVIDER_SITE_OTHER): Payer: Self-pay

## 2018-11-19 ENCOUNTER — Encounter: Payer: Self-pay | Admitting: Family Medicine

## 2018-11-20 ENCOUNTER — Encounter (INDEPENDENT_AMBULATORY_CARE_PROVIDER_SITE_OTHER): Payer: Self-pay | Admitting: Nurse Practitioner

## 2018-11-20 NOTE — Progress Notes (Signed)
SUBJECTIVE:  Patient ID: Shane Acevedo, male    DOB: August 08, 1933, 85 y.o.   MRN: [id number] No chief complaint on file.   HPI  Shane Acevedo is a 85 y.o. male that presents to our office again today with concern for temporal arteritis.  The patient was last seen on 09/22/2018 with concerning symptoms for temporal arteritis however he ultimately decided not to undergo biopsy.  In the interim the patient was subsequently diagnosed with a subdural hematoma and he continues to find himself with a continued headache.  He also endorses tenderness with palpation over the right temporal artery area.  There is some concern that the headaches that the patient has may be due to the concurrent subdural hematoma.  The patient has remained on prednisone and there has not been a discernible change in the headache level.  Past Medical History:  Diagnosis Date   Allergy    Anxiety    Depression    Heart disease    Hypertension     Past Surgical History:  Procedure Laterality Date   CORONARY STENT PLACEMENT Right    TONSILLECTOMY     TOTAL HIP ARTHROPLASTY Right     Social History   Socioeconomic History   Marital status: Married    Spouse name: Not on file   Number of children: Not on file   Years of education: Not on file   Highest education level: Not on file  Occupational History   Not on file  Social Needs   Financial resource strain: Not hard at all   Food insecurity    Worry: Never true    Inability: Never true   Transportation needs    Medical: No    Non-medical: No  Tobacco Use   Smoking status: Never Smoker   Smokeless tobacco: Never Used  Substance and Sexual Activity   Alcohol use: Not Currently   Drug use: No   Sexual activity: Not Currently  Lifestyle   Physical activity    Days per week: 3 days    Minutes per session: 60 min   Stress: Not at all  Relationships   Social connections    Talks on phone: Not on file    Gets  together: Not on file    Attends religious service: Not on file    Active member of club or organization: Not on file    Attends meetings of clubs or organizations: Not on file    Relationship status: Not on file   Intimate partner violence    Fear of current or ex partner: No    Emotionally abused: No    Physically abused: No    Forced sexual activity: No  Other Topics Concern   Not on file  Social History Narrative   Not on file    Family History  Problem Relation Age of Onset   Alcoholism Other    Arthritis Other    Lung cancer Other    Mental illness Other    Emphysema Mother    Bipolar disorder Father    Coronary artery disease Sister    Schizophrenia Sister     No Known Allergies   Review of Systems   Review of Systems: Negative Unless Checked Constitutional: [] Weight loss  [] Fever  [] Chills Cardiac: [] Chest pain   []  Atrial Fibrillation  [] Palpitations   [] Shortness of breath when laying flat   [] Shortness of breath with exertion. [] Shortness of breath at rest Vascular:  [] Pain in legs with walking   []   Pain in legs with standing [] Pain in legs when laying flat   [] Claudication    [] Pain in feet when laying flat    [] History of DVT   [] Phlebitis   [] Swelling in legs   [] Varicose veins   [] Non-healing ulcers Pulmonary:   [] Uses home oxygen   [] Productive cough   [] Hemoptysis   [] Wheeze  [] COPD   [] Asthma Neurologic:  [] Dizziness   [] Seizures  [] Blackouts [] History of stroke   [] History of TIA  [] Aphasia   [] Temporary Blindness   [] Weakness or numbness in arm   [] Weakness or numbness in leg Musculoskeletal:   [] Joint swelling   [] Joint pain   [] Low back pain  []  History of Knee Replacement [] Arthritis [] back Surgeries  []  Spinal Stenosis    Hematologic:  [] Easy bruising  [] Easy bleeding   [] Hypercoagulable state   [] Anemic Gastrointestinal:  [] Diarrhea   [] Vomiting  [] Gastroesophageal reflux/heartburn   [] Difficulty swallowing. [] Abdominal pain Genitourinary:   [] Chronic kidney disease   [] Difficult urination  [] Anuric   [] Blood in urine [] Frequent urination  [] Burning with urination   [] Hematuria Skin:  [] Rashes   [] Ulcers [] Wounds Psychological:  [x] History of anxiety   [x]  History of major depression  []  Memory Difficulties      OBJECTIVE:   Physical Exam  BP 119/72 (BP Location: Right Arm)    Pulse 66    Resp 15    Wt 170 lb 6.4 oz (77.3 kg)    BMI 25.91 kg/m   Gen: WD/WN, NAD Head: [place]/AT, No temporalis wasting.  Ear/Nose/Throat: Hearing grossly intact, nares w/o erythema or drainage Eyes: PER, EOMI, sclera nonicteric.  Neck: Supple, no masses.  No JVD.  Pulmonary:  Good air movement, no use of accessory muscles.  Cardiac: RRR Vascular:  Temporal artery present, tender to palpation Vessel Right Left  Radial Palpable Palpable   Gastrointestinal: soft, non-distended. No guarding/no peritoneal signs.  Musculoskeletal: M/S 5/5 throughout.  No deformity or atrophy.  Neurologic: Pain and light touch intact in extremities.  Symmetrical.  Speech is fluent. Motor exam as listed above. Psychiatric: Judgment intact, Mood & affect appropriate for pt's clinical situation. Dermatologic: No Venous rashes. No Ulcers Noted.  No changes consistent with cellulitis. Lymph : No Cervical lymphadenopathy, no lichenification or skin changes of chronic lymphedema.       ASSESSMENT AND PLAN:  1. Acute nonintractable headache, unspecified headache type Based upon the description of symptoms, temporal arteritis is a possible differential but a subdural hematoma could also be behind the cause of the patient's pain.  However, as a temporal artery biopsy is the gold standard for diagnosing temporal arteritis and the procedure carries little risk, it is in the best interest that the patient proceed with the procedure.    The procedure, risks, benefits and alternatives were discussed with the patient and the patient agrees to proceed with temporal artery biopsy.   Patient will follow up in office after temporal artery biopsy.    2. Gastroesophageal reflux disease, unspecified whether esophagitis present Continue PPI as already ordered, this medication has been reviewed and there are no changes at this time.  Avoidence of caffeine and alcohol  Moderate elevation of the head of the bed   3. Essential hypertension Continue antihypertensive medications as already ordered, these medications have been reviewed and there are no changes at this time.    Current Outpatient Medications on File Prior to Visit  Medication Sig Dispense Refill   amLODipine (NORVASC) 10 MG tablet TAKE 1 TABLET DAILY 90  tablet 4   aspirin EC 81 MG tablet Take 81 mg by mouth daily.     atorvastatin (LIPITOR) 40 MG tablet Take 1 tablet (40 mg total) by mouth daily. 90 tablet 1   Cholecalciferol (VITAMIN D3 PO) Take 1,000 Units by mouth.     Multiple Vitamins-Minerals (CENTRUM SILVER PO) Take by mouth.     omeprazole (PRILOSEC) 20 MG capsule Take 2 capsules (40 mg total) by mouth daily. 28 capsule 0   oxybutynin (DITROPAN-XL) 5 MG 24 hr tablet TAKE 1 TABLET AT BEDTIME 90 tablet 4   predniSONE (DELTASONE) 10 MG tablet Take 3 tablets (30 mg total) by mouth daily with breakfast for 14 days, THEN 2 tablets (20 mg total) daily with breakfast for 14 days, THEN 1 tablet (10 mg total) daily with breakfast for 14 days, THEN 0.5 tablets (5 mg total) daily with breakfast for 14 days. Start after completing your 40 mg dosing of prednisone.. 91 tablet 0   ezetimibe (ZETIA) 10 MG tablet Take 1 tablet (10 mg total) by mouth daily. 90 tablet 3   No current facility-administered medications on file prior to visit.     There are no Patient Instructions on file for this visit. No follow-ups on file.   Kris Hartmann, NP  This note was completed with Sales executive.  Any errors are purely unintentional.

## 2018-11-21 ENCOUNTER — Telehealth: Payer: Self-pay

## 2018-11-21 ENCOUNTER — Encounter: Payer: Self-pay | Admitting: Family Medicine

## 2018-11-21 ENCOUNTER — Other Ambulatory Visit (INDEPENDENT_AMBULATORY_CARE_PROVIDER_SITE_OTHER): Payer: Self-pay | Admitting: Nurse Practitioner

## 2018-11-21 ENCOUNTER — Other Ambulatory Visit: Admission: RE | Admit: 2018-11-21 | Payer: Medicare Other | Source: Ambulatory Visit

## 2018-11-21 ENCOUNTER — Encounter (INDEPENDENT_AMBULATORY_CARE_PROVIDER_SITE_OTHER): Payer: Self-pay

## 2018-11-21 NOTE — Telephone Encounter (Signed)
He should contact the vascular surgeons office to get their input on whether or not he can have the temporal artery biopsy completed while on the prednisone.

## 2018-11-21 NOTE — Telephone Encounter (Signed)
Pt said he is on and will not have that test done, he said according to the [facility] mychart if u are on high dose of prednisone u should not have that vascular procedure done  predniSONE (DELTASONE) 10 MG tablet.  Nina,cma

## 2018-11-21 NOTE — Telephone Encounter (Signed)
Copied from [place] #[id number]. Topic: General - Other >> Nov 21, 2018  8:35 AM Carolyn Stare wrote: Pt said he is on and will not have that test done, he said according to the [facility] mychart if u are on high dose of prednisone u should not have that vascular procedure done  predniSONE (DELTASONE) 10 MG tablet

## 2018-11-24 ENCOUNTER — Ambulatory Visit: Admission: RE | Admit: 2018-11-24 | Payer: Medicare Other | Source: Home / Self Care | Admitting: Vascular Surgery

## 2018-11-24 ENCOUNTER — Encounter: Admission: RE | Payer: Self-pay | Source: Home / Self Care

## 2018-11-24 SURGERY — BIOPSY TEMPORAL ARTERY
Anesthesia: General | Laterality: Right

## 2018-11-26 ENCOUNTER — Other Ambulatory Visit: Payer: Self-pay | Admitting: Cardiovascular Disease

## 2018-11-29 DIAGNOSIS — G4733 Obstructive sleep apnea (adult) (pediatric): Secondary | ICD-10-CM | POA: Diagnosis not present

## 2018-11-29 DIAGNOSIS — R0602 Shortness of breath: Secondary | ICD-10-CM | POA: Diagnosis not present

## 2018-11-30 ENCOUNTER — Telehealth: Payer: Self-pay | Admitting: Internal Medicine

## 2018-11-30 DIAGNOSIS — G4733 Obstructive sleep apnea (adult) (pediatric): Secondary | ICD-10-CM | POA: Diagnosis not present

## 2018-11-30 DIAGNOSIS — R0602 Shortness of breath: Secondary | ICD-10-CM | POA: Diagnosis not present

## 2018-11-30 NOTE — Telephone Encounter (Signed)
How many prednisone 10 mg tablets do you have left? We could do the following taper to bring you off it a little quicker: 30 mg x 1 week, 20 x week, 10 x 1 week, 5 x 1 week, then stop. Do you need more prednisone to complete this taper?  Dr Caryl Bis

## 2018-11-30 NOTE — Telephone Encounter (Signed)
Dr Caryl Bis, I have 62 pills left as of 11/29/2018 (so I do not need more pills to taper to your plan) because I dropped down to 2 each 20 mg pills a day instead of taking 3 each.  Should I go back up to 3 each (30 mg) as you just outlined, or just keep on 20 mg for next 2 weeks? I am not having temporal arteritis type symptoms now, mostly subdural hematoma -dizziness, . I have an appointment with Dr Britt Bottom Neurologist on December 8th.  He probably will order CT scan frontal lobe to monitor SDH. I had problems sleeping, so he suggested 5mg  melatonin, which seems to help. Supposedly, the [employer] [facility] therapist from [place] will set me up with equipment today 11/29/2018 with the overnight sleep study you ordered. Thanks, [name]         How many prednisone 10 mg tablets do you have left? We could do the following taper to bring you off it a little quicker: 30 mg x 1 week, 20 x week, 10 x 1 week, 5 x 1 week, then stop. Do you need more prednisone to complete this taper?  Dr Caryl Bis   See above my chart message   Advise pt follow taper as Dr. Chauncey Cruel stated above Does he want [place] to order stat MRI for headaches??  Melissa  Call Dr. Weber Cooks office to schedule sooner appt    [place]

## 2018-12-04 ENCOUNTER — Telehealth: Payer: Self-pay | Admitting: Internal Medicine

## 2018-12-04 NOTE — Telephone Encounter (Signed)
Dizziness h/o SDH needs appt with Neurology or rec ED  See prior message from below     [place]      Sent:12/02/2018  2:35 PM EST        [id number] N McLean-Scocuzza, MD   Subject:RE: Visit Follow-Up Question  [name] reply "I will start tapering to 7 days of 10mg  Prednisone, 12/03/2018 and finish 7 days of 5mg  by 12/16/2018. I will contact Dr. Melrose Nakayama to see if appointment needs to be moved up and coordinate MRI/and or CT scan for subdural hematoma follow up. Also, if dizziness not worsening, could the frontal brain imaging be  performed at [facility] outpatient, site of last follow-up CT scan 10/31/2018, instead of [facility] ER, site of initial MRIs and CT, 10/06/2018?   I do not seem to be experiencing symptoms of giant cell temporal arteritis since being on prednisone (was periodic dull pain right temple when shaking head and sore right jaw).  After tapering off prednisone, I will monitor and report immediately if symptoms return.  I finished in-Apt sleep study yesterday, but nasal-chest equipment stimulation caused me to get only 4 to 5 hours sleep, so it may not give useful results with everything else going on, dizziness/prednisone.  Thanks, [name]   ----- Message -----      From:Darrell Leonhardt N McLean-Scocuzza, MD      Sent:11/30/2018 12:46 PM EST        [id number] Garlon Hatchet   Subject:RE: Visit Follow-Up Question  The nurse Gae Bon will call you  You should follow taper as Dr. Chauncey Cruel advised  And do you want [place] to order MRI of your brain Call neurology to try to get appt moved up or go to the Emergency Room for hematoma follow up or worsening dizziness    ----- Message -----      From:Epic Garlon Hatchet      Sent:11/29/2018 11:20 AM EST        BX:[id number] Caryl Bis, MD   Subject:RE: Visit Follow-Up Question  Dr Caryl Bis, I have 62 pills left as of 11/29/2018 (so I do not need more pills to taper to your plan) because I dropped down to 2 each 20 mg pills a day  instead of taking 3 each.  Should I go back up to 3 each (30 mg) as you just outlined, or just keep on 20 mg for next 2 weeks? I am not having temporal arteritis type symptoms now, mostly subdural hematoma -dizziness, . I have an appointment with Dr Britt Bottom Neurologist on December 8th.  He probably will order CT scan frontal lobe to monitor SDH. I had problems sleeping, so he suggested 5mg  melatonin, which seems to help. Supposedly, the [employer] [facility] therapist from [place] will set me up with equipment today 11/29/2018 with the overnight sleep study you ordered. Thanks, [name]   ----- Message -----      From:Eric Caryl Bis, MD      Sent:11/23/2018 12:59 PM EST        [id number] Garlon Hatchet   Subject:RE: Visit Follow-Up Question  How many prednisone 10 mg tablets do you have left? We could do the following taper to bring you off it a little quicker: 30 mg x 1 week, 20 x week, 10 x 1 week, 5 x 1 week, then stop. Do you need more prednisone to complete this taper?  Dr Caryl Bis   ----- Message -----      From:Everton Garlon Hatchet  Sent:11/19/2018  9:19 AM EST        BX:[id number] Caryl Bis, MD   Subject:Visit Follow-Up Question  Dr. Caryl Bis, quoting my Brooks "If taking high-dose corticosteroids, .. temporal arterial biopsy result may not be accurate. ". Biopsies must be done soon as possible". "Biopsies done more than about a week after the start of high-dose corticosteroids may be falsely normal (false-negative".  I just used up 40mg  prescription and am on 30mg  for next 13 days, 20mg  next 14 days, etc. and won't be totally off until January. In my best health interests for accurate diagnosis, we should reschedule the temporal artery biopsy until I am off the prednisone. When do you suggest I stop taking it? Sorry for the inconvenience to [facility], but no one addressed this important medical alert. I will send this  to Ms. Arna Medici. Thanks, [name]

## 2018-12-05 ENCOUNTER — Other Ambulatory Visit: Payer: Self-pay | Admitting: Neurology

## 2018-12-05 ENCOUNTER — Telehealth: Payer: Self-pay

## 2018-12-05 DIAGNOSIS — S065XAA Traumatic subdural hemorrhage with loss of consciousness status unknown, initial encounter: Secondary | ICD-10-CM

## 2018-12-05 DIAGNOSIS — S065X9A Traumatic subdural hemorrhage with loss of consciousness of unspecified duration, initial encounter: Secondary | ICD-10-CM

## 2018-12-05 NOTE — Telephone Encounter (Signed)
I called and scheduled patient for a virtual appointment tomorrow @ 1 pm per Dr. Olivia Mackie Mclea-Scoccuzza.  Deitrick Ferreri,cma

## 2018-12-06 ENCOUNTER — Ambulatory Visit (INDEPENDENT_AMBULATORY_CARE_PROVIDER_SITE_OTHER): Payer: Medicare Other | Admitting: Internal Medicine

## 2018-12-06 ENCOUNTER — Encounter: Payer: Self-pay | Admitting: Internal Medicine

## 2018-12-06 ENCOUNTER — Other Ambulatory Visit: Payer: Self-pay

## 2018-12-06 VITALS — BP 119/66 | HR 63 | Temp 98.1°F | Ht 68.0 in | Wt 168.0 lb

## 2018-12-06 DIAGNOSIS — R42 Dizziness and giddiness: Secondary | ICD-10-CM | POA: Diagnosis not present

## 2018-12-06 DIAGNOSIS — G4700 Insomnia, unspecified: Secondary | ICD-10-CM

## 2018-12-06 DIAGNOSIS — S065XAA Traumatic subdural hemorrhage with loss of consciousness status unknown, initial encounter: Secondary | ICD-10-CM

## 2018-12-06 DIAGNOSIS — S065X9A Traumatic subdural hemorrhage with loss of consciousness of unspecified duration, initial encounter: Secondary | ICD-10-CM

## 2018-12-06 DIAGNOSIS — I10 Essential (primary) hypertension: Secondary | ICD-10-CM | POA: Diagnosis not present

## 2018-12-06 NOTE — Progress Notes (Signed)
Virtual Visit via Video Note  I connected with Shane Acevedo  on 12/06/18 at  1:00 PM EST by a video enabled telemedicine application and verified that I am speaking with the correct person using two identifiers.  Location patient: home Location provider:work or home office Persons participating in the virtual visit: patient, provider  I discussed the limitations of evaluation and management by telemedicine and the availability of in person appointments. The patient expressed understanding and agreed to proceed.   HPI: 1. Intermittent dizziness x months since 1st week of 08/2018 when he had LOC w/o trauma or fall or hitting his head prior MRIs and CT + 6 mm SDH it was thought he had temporal arteritis at 1 time and he is tapering down on prednisone now on 5 mg  Will do x 1 week and then taper off CT head sch 12/16/2018 per Dr. Melrose Nakayama and f/u 12/20/2018 with Dr. Melrose Nakayama  Dizziness worse with moving head side to side room at times is spinning, dizziness worse with bending or stopping and less with squatting and he has dizziness at times with walking and doing his exercise I.e walking or swimming. Denies hearing loss or vision loss but he was having ear/jaw/temportal pain in 07/2018.  At times his head feels full as well   BP today 119/66 and see other vitals. He reports he is not skipping meals   2. HTN on norvasc 10 mg qd     ROS: See pertinent positives and negatives per HPI.  Past Medical History:  Diagnosis Date  . Allergy   . Anxiety   . Depression   . Heart disease   . Hypertension     Past Surgical History:  Procedure Laterality Date  . CORONARY STENT PLACEMENT Right   . TONSILLECTOMY    . TOTAL HIP ARTHROPLASTY Right     Family History  Problem Relation Age of Onset  . Alcoholism Other   . Arthritis Other   . Lung cancer Other   . Mental illness Other   . Emphysema Mother   . Bipolar disorder Father   . Coronary artery disease Sister   . 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  amLODipine (NORVASC) 10 MG tablet, TAKE 1 TABLET DAILY, Disp: 90 tablet, Rfl: 4 .  aspirin EC 81 MG tablet, Take 81 mg by mouth daily., Disp: , Rfl:  .  atorvastatin (LIPITOR) 40 MG tablet, Take 1 tablet (40 mg total) by mouth daily., Disp: 90 tablet, Rfl: 1 .  Cholecalciferol (VITAMIN D3 PO), Take 1,000 Units by mouth., Disp: , Rfl:  .  ezetimibe (ZETIA) 10 MG tablet, TAKE 1 TABLET DAILY, Disp: 90 tablet, Rfl: 2 .  Multiple Vitamins-Minerals (CENTRUM SILVER PO), Take by mouth., Disp: , Rfl:  .  omeprazole (PRILOSEC) 20 MG capsule, Take 2 capsules (40 mg total) by mouth daily., Disp: 28 capsule, Rfl: 0 .  oxybutynin (DITROPAN-XL) 5 MG 24 hr tablet, TAKE 1 TABLET AT BEDTIME, Disp: 90 tablet, Rfl: 4 .  predniSONE (DELTASONE) 10 MG tablet, Take 3 tablets (30 mg total) by mouth daily with breakfast for 14 days, THEN 2 tablets (20 mg total) daily with breakfast for 14 days, THEN 1 tablet (10 mg total) daily with breakfast for 14 days, THEN 0.5 tablets (5 mg total) daily with breakfast for 14 days. Start after completing  your 40 mg dosing of prednisone.., Disp: 91 tablet, Rfl: 0  EXAM:  VITALS per patient if applicable:  GENERAL: alert, oriented, appears well and in no acute distress  HEENT: atraumatic, conjunttiva clear, no obvious abnormalities on inspection of external nose and ears  NECK: normal movements of the head and neck  LUNGS: on inspection no signs of respiratory distress, breathing rate appears normal, no obvious gross SOB, gasping or wheezing  CV: no obvious cyanosis  MS: moves all visible extremities without noticeable abnormality  PSYCH/NEURO: pleasant and cooperative, no obvious depression or anxiety, speech and thought processing grossly intact  ASSESSMENT AND PLAN:  Discussed the following assessment and plan:  Dizziness  with h/o SDH (subdural hematoma)  -f/u with CT head 12/16/2018 per neurology and appt 12/20/18 with Dr. Melrose Nakayama  Disc getting up slowly, adequate hydration  If imaging unchanged consider dizziness related to meds I.e oxybutynin or he made need repeat MRI per neurology recs  -of note he is tapering off steroids on prednisone 5 mg qd x 1 more week then off for temporal arteritis -if dizziness continues consider f/u cards he just saw 09/2018 and no issues   Essential hypertension Cont norvasc 10 mg qd   Insomnia  -melatonin 5 mg qhs is helping with sleep for now   Also asking about home sleep study no results in chart will CC PCP he reports only slept 3-4 hrs for test so he may need to repeat it   -we discussed possible serious and likely etiologies, options for evaluation and workup, limitations of telemedicine visit vs in person visit, treatment, treatment risks and precautions. Pt prefers to treat via telemedicine empirically rather then risking or undertaking an in person visit at this moment. Patient agrees to seek prompt in person care if worsening, new symptoms arise, or if is not improving with treatment.   I discussed the assessment and treatment plan with the patient. The patient was provided an opportunity to ask questions and all were answered. The patient agreed with the plan and demonstrated an understanding of the instructions.   The patient was advised to call back or seek an in-person evaluation if the symptoms worsen or if the condition fails to improve as anticipated.  Time spent 20 minutes  Delorise Jackson, MD

## 2018-12-06 NOTE — Patient Instructions (Addendum)
Dizziness Dizziness is a common problem. It is a feeling of unsteadiness or light-headedness. You may feel like you are about to faint. Dizziness can lead to injury if you stumble or fall. Anyone can become dizzy, but dizziness is more common in older adults. This condition can be caused by a number of things, including medicines, dehydration, or illness. Follow these instructions at home: Eating and drinking  Drink enough fluid to keep your urine clear or pale yellow. This helps to keep you from becoming dehydrated. Try to drink more clear fluids, such as water.  Do not drink alcohol.  Limit your caffeine intake if told to do so by your health care provider. Check ingredients and nutrition facts to see if a food or beverage contains caffeine.  Limit your salt (sodium) intake if told to do so by your health care provider. Check ingredients and nutrition facts to see if a food or beverage contains sodium. Activity  Avoid making quick movements. ? Rise slowly from chairs and steady yourself until you feel okay. ? In the morning, first sit up on the side of the bed. When you feel okay, stand slowly while you hold onto something until you know that your balance is fine.  If you need to stand in one place for a long time, move your legs often. Tighten and relax the muscles in your legs while you are standing.  Do not drive or use heavy machinery if you feel dizzy.  Avoid bending down if you feel dizzy. Place items in your home so that they are easy for you to reach without leaning over. Lifestyle  Do not use any products that contain nicotine or tobacco, such as cigarettes and e-cigarettes. If you need help quitting, ask your health care provider.  Try to reduce your stress level by using methods such as yoga or meditation. Talk with your health care provider if you need help to manage your stress. General instructions  Watch your dizziness for any changes.  Take over-the-counter and  prescription medicines only as told by your health care provider. Talk with your health care provider if you think that your dizziness is caused by a medicine that you are taking.  Tell a friend or a family member that you are feeling dizzy. If he or she notices any changes in your behavior, have this person call your health care provider.  Keep all follow-up visits as told by your health care provider. This is important. Contact a health care provider if:  Your dizziness does not go away.  Your dizziness or light-headedness gets worse.  You feel nauseous.  You have reduced hearing.  You have new symptoms.  You are unsteady on your feet or you feel like the room is spinning. Get help right away if:  You vomit or have diarrhea and are unable to eat or drink anything.  You have problems talking, walking, swallowing, or using your arms, hands, or legs.  You feel generally weak.  You are not thinking clearly or you have trouble forming sentences. It may take a friend or family member to notice this.  You have chest pain, abdominal pain, shortness of breath, or sweating.  Your vision changes.  You have any bleeding.  You have a severe headache.  You have neck pain or a stiff neck.  You have a fever. These symptoms may represent a serious problem that is an emergency. Do not wait to see if the symptoms will go away. Get medical help   right away. Call your local emergency services (911 in the [place] Do not drive yourself to the hospital. Summary  Dizziness is a feeling of unsteadiness or light-headedness. This condition can be caused by a number of things, including medicines, dehydration, or illness.  Anyone can become dizzy, but dizziness is more common in older adults.  Drink enough fluid to keep your urine clear or pale yellow. Do not drink alcohol.  Avoid making quick movements if you feel dizzy. Monitor your dizziness for any changes. This information is not intended to  replace advice given to you by your health care provider. Make sure you discuss any questions you have with your health care provider. Document Released: 06/24/2000 Document Revised: 01/01/2017 Document Reviewed: 02/01/2016 Elsevier Patient Education  2020 [place].    Subdural Hematoma  A subdural hematoma is a collection of blood between the brain and its outer covering (dura). As the amount of blood increases, pressure builds on the brain. There are two types of subdural hematomas:  Acute. This type develops shortly after a hard, direct hit to the head and causes blood to collect very quickly. This is a medical emergency. If it is not diagnosed and treated quickly, it can lead to severe brain injury or death.  Chronic. This is when bleeding develops more slowly, over weeks or months. In some cases, this type does not cause symptoms. What are the causes? This condition is caused by bleeding (hemorrhage) from a broken (ruptured) blood vessel. In most cases, a blood vessel ruptures and bleeds because of a head injury, such as from a hard, direct hit. Head injuries can happen in car accidents, falls, assaults, or while playing sports. In rare cases, a hemorrhage can happen without a known cause (spontaneously), especially if you take blood thinners (anticoagulants). What increases the risk? This condition is more likely to develop in:  Older people.  Infants.  People who take blood thinners.  People who have head injuries.  People who abuse alcohol. What are the signs or symptoms? Symptoms of this condition can vary depending on the size of the hematoma. Symptoms can be mild, severe, or life-threatening. They include:  Headaches.  Nausea or vomiting.  Changes in vision, such as double vision or loss of vision.  Changes in speech or trouble understanding what people say.  Loss of balance or trouble walking.  Weakness, numbness, or tingling in the arms or legs, especially  on one side of the body.  Seizures.  Change in personality.  Increased sleepiness.  Memory loss.  Loss of consciousness.  Coma. Symptoms of acute subdural hematoma can develop over minutes or hours. Symptoms of chronic subdural hematoma may develop over weeks or months. How is this diagnosed? This condition is diagnosed based on the results of:  A physical exam.  Tests of strength, reflexes, coordination, senses, manner of walking (gait), and facial and eye movements (neurological exam).  Imaging tests, such as an MRI or a CT scan. How is this treated? Treatment for this condition depends on the type of hematoma and how severe it is. Treatment for acute hematoma may include:  Emergency surgery to drain blood or remove a blood clot.  Medicines that help the body get rid of excess fluids (diuretics). These may help to reduce pressure in the brain.  Assisted breathing (ventilation). Treatment for chronic hematoma may include:  Observation and bed rest at the hospital.  Surgery. If you take blood thinners, you may need to stop taking them for a  short time. You may also be given anti-seizure (anticonvulsant) medicine. Sometimes, no treatment is needed for chronic subdural hematoma. Follow these instructions at home: Activity  Avoid situations where you could injure your head again, such as in competitive sports, downhill snow sports, and horseback riding. Do not do these activities until your health care provider approves. ? Wear protective gear, such as a helmet, when participating in activities such as biking or contact sports.  Avoid too much visual stimulation while recovering. This means limiting how much you read and limiting your screen time on a smart phone, tablet, computer, or TV.  Rest as told by your health care provider. Rest helps the brain heal.  Try to avoid activities that cause physical or mental stress. Return to work or school as told by your health care  provider.  Do not lift anything that is heavier than 5 lb (2.3 kg), or the limit you are told, until your health care provider says that it is safe.  Do not drive, ride a bike, or use heavy machinery until your health care provider approves.  Always wear your seat belt when you are in a motor vehicle. Alcohol use  Do not drink alcohol if your health care provider tells you not to drink.  If you drink alcohol, limit how much you use to: ? 0-1 drink a day for women. ? 0-2 drinks a day for men. General instructions  Monitor your symptoms, and ask people around you to do the same. Recovery from brain injuries varies. Talk with your health care provider about what to expect.  Take over-the-counter and prescription medicines only as told by your health care provider. Do not take blood thinners or NSAIDs unless your health care provider approves. These include aspirin, ibuprofen, naproxen, and warfarin.  Keep your home environment safe to reduce the risk of falling.  Keep all follow-up visits as told by your health care provider. This is important. Where to find more information  [employer] of Neurological Disorders and Stroke: MasterBoxes.it  American Academy of Neurology (AAN): http://keith.biz/  Brain Injury Association of [place]: www.biausa.org Get help right away if you:  Are taking blood thinners and you fall or you experience minor trauma to the head. If you take any blood thinners, even a very small injury can cause a subdural hematoma.  Have a bleeding disorder and you fall or you experience minor trauma to the head.  Develop any of the following symptoms after a head injury: ? Clear fluid draining from your nose or ears. ? Nausea or vomiting. ? Changes in speech or trouble understanding what people say. ? Seizures. ? Drowsiness or a decrease in alertness. ? Double vision. ? Numbness or inability to move (paralysis) in any part of your body. ? Difficulty walking or  poor coordination. ? Difficulty thinking. ? Confusion or forgetfulness. ? Personality changes. ? Irrational or aggressive behavior. These symptoms may represent a serious problem that is an emergency. Do not wait to see if the symptoms will go away. Get medical help right away. Call your local emergency services (911 in the [place] Do not drive yourself to the hospital. Summary  A subdural hematoma is a collection of blood between the brain and its outer covering (dura).  Treatment for this condition depends on what type of subdural hematoma you have and how severe it is.  Symptoms can vary from mild to severe to life-threatening.  Monitor your symptoms, and ask others around you to do the same. This  information is not intended to replace advice given to you by your health care provider. Make sure you discuss any questions you have with your health care provider. Document Released: 11/16/2003 Document Revised: 11/29/2017 Document Reviewed: 11/29/2017 Elsevier Patient Education  2020 [employer].

## 2018-12-15 ENCOUNTER — Telehealth: Payer: Self-pay

## 2018-12-15 ENCOUNTER — Encounter: Payer: Self-pay | Admitting: Family Medicine

## 2018-12-15 ENCOUNTER — Ambulatory Visit (INDEPENDENT_AMBULATORY_CARE_PROVIDER_SITE_OTHER): Payer: 59 | Admitting: Family Medicine

## 2018-12-15 ENCOUNTER — Telehealth: Payer: Self-pay | Admitting: Family Medicine

## 2018-12-15 VITALS — Temp 98.3°F

## 2018-12-15 DIAGNOSIS — G4733 Obstructive sleep apnea (adult) (pediatric): Secondary | ICD-10-CM

## 2018-12-15 DIAGNOSIS — J069 Acute upper respiratory infection, unspecified: Secondary | ICD-10-CM | POA: Diagnosis not present

## 2018-12-15 NOTE — Patient Instructions (Signed)
-   Get Tested for Coronavirus - Isolate until the results come back - if no improvement > would recommend that you reach out to your PCP - Try an allergy medication - Claritin or Zyrtec - Reschedule your CT scan until you get your results back   Based on your symptoms, it looks like you have a virus.   Antibiotics are not need for a viral infection but the following will help:   1. Drink plenty of fluids 2. Get lots of rest  Sinus Congestion 1) Neti Pot (Saline rinse) -- 2 times day -- if tolerated 2) Flonase (Store Brand ok) - once daily 3) Over the counter congestion medications  Cough 1) Cough drops can be helpful 2) Nyquil (or nighttime cough medication) 3) Honey is proven to be one of the best cough medications  4) Cough medicine with Dextromethorphan can also be helpful  Sore Throat 1) Honey as above, cough drops 2) Ibuprofen or Aleve can be helpful 3) Salt water Gargles  If you develop fevers (Temperature >100.4), chills, worsening symptoms or symptoms lasting longer than 10 days return to clinic.

## 2018-12-15 NOTE — Telephone Encounter (Signed)
Please advise the patient that his sleep study revealed sleep apnea.  They recommended a CPAP titration study.  If he is willing to do that I can place an order and we can get that completed.  It would require him going to a facility to sleep and be monitored.  If he is not willing to do that I can order an AutoPap for him.  Please see which of these he would prefer to do.  Thanks.

## 2018-12-15 NOTE — Telephone Encounter (Signed)
I called and spoke with the patient and scheduled him a virtual visit per Dr. Caryl Bis with [place] today to rule out an aspiration or covid 19  Nina,cma

## 2018-12-15 NOTE — Progress Notes (Signed)
I connected with Robyne Askew on 12/15/18 at  3:00 PM EST by video and verified that I am speaking with the correct person using two identifiers.   I discussed the limitations, risks, security and privacy concerns of performing an evaluation and management service by video and the availability of in person appointments. I also discussed with the patient that there may be a patient responsible charge related to this service. The patient expressed understanding and agreed to proceed.  Patient location: Home Provider Location: [place] Participants: Lesleigh Noe and Robyne Askew   Subjective:     Shane Acevedo is a 85 y.o. male presenting for Cough (tickle in throat) and Wheezing     Cough This is a new problem. The current episode started in the past 7 days. The problem occurs constantly. The cough is non-productive. Associated symptoms include ear pain, myalgias, shortness of breath (with activity) and wheezing. Pertinent negatives include no chills, fever, headaches, nasal congestion, postnasal drip, rhinorrhea or sore throat. Exacerbated by: breathing through the nose, drinking. He has tried nothing (warm tea and fluids) for the symptoms. There is no history of asthma or COPD.  Wheezing  Associated symptoms include coughing, ear pain and shortness of breath (with activity). Pertinent negatives include no chills, fever, headaches, rhinorrhea or sore throat. There is no history of asthma or COPD.   [facility] [place] - normal temperature each day  Has a subdural hematoma which is being followed with a CT scan planned for tomorrow  Was on prednisone for jaw pain for possible temporal arteritis  Gets dizziness 2/2 to the subdural hematoma  Review of Systems  Constitutional: Negative for chills and fever.  HENT: Positive for ear pain. Negative for postnasal drip, rhinorrhea, sneezing and sore throat.   Eyes:  Negative for itching.  Respiratory: Positive for cough, shortness of breath (with activity) and wheezing.   Musculoskeletal: Positive for myalgias.  Neurological: Negative for headaches.     Social History   Tobacco Use  Smoking Status Never Smoker  Smokeless Tobacco Never Used        Objective:   BP Readings from Last 3 Encounters:  12/06/18 119/66  11/18/18 110/60  11/16/18 119/72   Wt Readings from Last 3 Encounters:  12/06/18 168 lb (76.2 kg)  11/18/18 171 lb 6.4 oz (77.7 kg)  11/16/18 170 lb 6.4 oz (77.3 kg)   Temp 98.3 F (36.8 C) (Temporal)    Physical Exam Constitutional:      Appearance: Normal appearance. He is not ill-appearing.     Comments: Stutter   HENT:     Head: Normocephalic and atraumatic.     Right Ear: External ear normal.     Left Ear: External ear normal.  Eyes:     Conjunctiva/sclera: Conjunctivae normal.  Pulmonary:     Effort: Pulmonary effort is normal. No respiratory distress.  Neurological:     Mental Status: He is alert. Mental status is at baseline.  Psychiatric:        Mood and Affect: Mood normal.        Behavior: Behavior normal.        Thought Content: Thought content normal.        Judgment: Judgment normal.             Assessment & Plan:   Problem List Items Addressed This Visit    None    Visit Diagnoses  Viral URI    -  Primary     Discussed OTC treatment for viral illness Given symptoms could also try allergy medication Given symptoms possible covid-19 and would recommend being tested ER precautions given  Need to reschedule CT scan and likely neurology appointment next week - advised calling their office.   Pt worried about aspiration after cleaning filter - given no severe SOB or fever, lower suspicion for pneumonia at this time. However if Covid negative and symptoms worsening advised calling back and would consider trial of Azithromycin for possible pneumonia.    Return if symptoms worsen or  fail to improve.  Lesleigh Noe, MD

## 2018-12-16 ENCOUNTER — Ambulatory Visit: Payer: Medicare Other

## 2018-12-16 ENCOUNTER — Other Ambulatory Visit: Payer: Self-pay

## 2018-12-16 DIAGNOSIS — Z20828 Contact with and (suspected) exposure to other viral communicable diseases: Secondary | ICD-10-CM | POA: Diagnosis not present

## 2018-12-16 DIAGNOSIS — Z20822 Contact with and (suspected) exposure to covid-19: Secondary | ICD-10-CM

## 2018-12-16 NOTE — Telephone Encounter (Signed)
Mailbox full. [place] for [employer] to advise.  Please advise the patient that his sleep study revealed sleep apnea.  They recommended a CPAP titration study.  If he is willing to do that I can place an order and we can get that completed.  It would require him going to a facility to sleep and be monitored.  If he is not willing to do that I can order an AutoPap for him.  Please see which of these he would prefer to do.  Thanks. Nina,cma

## 2018-12-16 NOTE — Telephone Encounter (Signed)
This has since been taken care of & correspondence made.

## 2018-12-17 LAB — NOVEL CORONAVIRUS, NAA: SARS-CoV-2, NAA: NOT DETECTED

## 2018-12-18 ENCOUNTER — Encounter: Payer: Self-pay | Admitting: Family Medicine

## 2018-12-19 ENCOUNTER — Telehealth: Payer: Self-pay

## 2018-12-19 NOTE — Telephone Encounter (Signed)
Noted. Agree with in person evaluation. Please follow-up with the patient tomorrow to make sure he went to get evaluated.

## 2018-12-19 NOTE — Telephone Encounter (Signed)
Copied from [place] [phone]. Topic: Appointment Scheduling - Scheduling Inquiry for Clinic >> Dec 19, 2018  3:13 PM Erick Blinks wrote: Reason for CRM: Pt's wife called requesting orders for bacterial infection lab orders, says she spoke with nurse about getting orders for lab tests.  Best contact: [phone]

## 2018-12-19 NOTE — Telephone Encounter (Signed)
Patient is going to [facility]gent care due to COVID symptoms see my chart message for symptoms.

## 2018-12-21 NOTE — Telephone Encounter (Signed)
Noted.  Thanks for following up with him.

## 2018-12-21 NOTE — Telephone Encounter (Signed)
Patient says he will go UC today he has not been and still has SOB with Exertion and cough advised patient he needs to go now he says he will go at 3 today to [facility] [place] here in [place] advised I would call back due to concern that he has not been evaluated. Patient did not answer phone on 12/20/18 stated he was sleeping .

## 2018-12-22 ENCOUNTER — Ambulatory Visit
Admission: EM | Admit: 2018-12-22 | Discharge: 2018-12-22 | Disposition: A | Discharge status: Home / Self Care | Payer: Medicare Other | Attending: Physician Assistant | Admitting: Physician Assistant

## 2018-12-22 ENCOUNTER — Ambulatory Visit
Admission: RE | Admit: 2018-12-22 | Discharge: 2018-12-22 | Disposition: A | Discharge status: Home / Self Care | Payer: Medicare Other | Attending: Physician Assistant | Admitting: Physician Assistant

## 2018-12-22 ENCOUNTER — Other Ambulatory Visit: Payer: Self-pay

## 2018-12-22 ENCOUNTER — Encounter: Payer: Self-pay | Admitting: *Deleted

## 2018-12-22 ENCOUNTER — Ambulatory Visit
Admission: RE | Admit: 2018-12-22 | Discharge: 2018-12-22 | Disposition: A | Discharge status: Home / Self Care | Payer: Medicare Other | Source: Ambulatory Visit | Attending: Physician Assistant | Admitting: Physician Assistant

## 2018-12-22 DIAGNOSIS — J189 Pneumonia, unspecified organism: Secondary | ICD-10-CM | POA: Diagnosis not present

## 2018-12-22 DIAGNOSIS — R0602 Shortness of breath: Secondary | ICD-10-CM | POA: Diagnosis not present

## 2018-12-22 DIAGNOSIS — J8410 Pulmonary fibrosis, unspecified: Secondary | ICD-10-CM | POA: Diagnosis not present

## 2018-12-22 DIAGNOSIS — I10 Essential (primary) hypertension: Secondary | ICD-10-CM

## 2018-12-22 DIAGNOSIS — R05 Cough: Secondary | ICD-10-CM | POA: Diagnosis not present

## 2018-12-22 MED ORDER — AZITHROMYCIN 250 MG PO TABS: 250.0000 mg | ORAL_TABLET | Freq: Every day | ORAL | 0 refills | Status: AC

## 2018-12-22 NOTE — Discharge Instructions (Signed)
Go to [facility] for your chest xray.  We will notify you of these results  Take the medication prescribed as directed.  If you become acutely short of breath, have a high fever or have other concerning symptoms go to the emergency department.  Your blood pressure was elevated today at 163/94, follow up with your primary care to have this rechecked in 2 weeks.

## 2018-12-22 NOTE — ED Triage Notes (Signed)
Patient reports cough x 10 days. Patient reports fatigue. Patient reports shortness of breath with activity. Patient had virtual visit on the 3rd with [place], reports covid test was taken on the 4th and it was negative.

## 2018-12-22 NOTE — ED Provider Notes (Signed)
[facility]    CSN: [id number] Arrival date & time: 12/22/18  1428      History   Chief Complaint Chief Complaint  Patient presents with  . Cough    HPI Shane Acevedo is a 85 y.o. male.   Patient reports to urgent care today for 10 days of cough and mild SOB. He was seen via telehealth by his primary care on 12/3 and tested negative for Covid on 12/4. He reports his cough and SOB have stayed constant and have neither improved or worsened significantly. He endorses a feeling that something is "in my throat".  He describes his SOB as mostly coming with his cough and with activity. He denies SOB at rest. Denies fever, chills, Headache, change in taste or smell, nausea, vomiting or diarrhea and chest pain.        Past Medical History:  Diagnosis Date  . Allergy   . Anxiety   . Depression   . Heart disease   . Hypertension     Patient Active Problem List   Diagnosis Date Noted  . Dizziness 12/06/2018  . Insomnia 12/06/2018  . Atypical chest pain 11/18/2018  . Subdural hematoma ([place]) 10/19/2018  . Acute nonintractable headache 09/20/2018  . GERD (gastroesophageal reflux disease) 04/29/2018  . 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 Abdominal aortic atherosclerosis ([place]) 02/15/2017  . Cardiac murmur 01/28/2017  . Carotid artery stenosis 01/28/2017  . Left lower quadrant pain 01/28/2017  . Benign prostatic hyperplasia with nocturia 01/22/2017  . History of compression fracture of spine 01/22/2017  . Hypertension 01/22/2017  . Hyperlipidemia 11/26/2016  . Smoking history 11/26/2016  . Actinic keratoses 07/04/2016  . Vision changes 07/04/2016  . Anxiety and depression 07/04/2016  . Coronary artery disease of native artery of native heart with stable angina pectoris ([place]) 07/04/2016  . Difficulty swallowing pills 07/04/2016  . OSA (obstructive sleep apnea) 07/04/2016  . Frequent urination 07/04/2016    Past Surgical History:  Procedure Laterality Date   . CORONARY STENT PLACEMENT Right   . TONSILLECTOMY    . TOTAL HIP ARTHROPLASTY Right        Home Medications    Prior to Admission medications   Medication Sig Start Date End Date Taking? Authorizing Provider  amLODipine (NORVASC) 10 MG tablet TAKE 1 TABLET DAILY 02/07/18   Leone Haven, MD  aspirin EC 81 MG tablet Take 81 mg by mouth daily.    [provider]  atorvastatin (LIPITOR) 40 MG tablet Take 1 tablet (40 mg total) by mouth daily. 10/24/18   Leone Haven, MD  azithromycin (ZITHROMAX) 250 MG tablet Take 1 tablet (250 mg total) by mouth daily. Take first 2 tablets together, then 1 every day until finished. 12/22/18   Nakeshia Waldeck, Marguerita Beards, PA-C  Cholecalciferol (VITAMIN D3 PO) Take 1,000 Units by mouth.    [provider]  ezetimibe (ZETIA) 10 MG tablet TAKE 1 TABLET DAILY 11/28/18   Minna Merritts, MD  Multiple Vitamins-Minerals (CENTRUM SILVER PO) Take by mouth.    [provider]  omeprazole (PRILOSEC) 20 MG capsule Take 2 capsules (40 mg total) by mouth daily. 04/12/18   Leone Haven, MD  oxybutynin (DITROPAN-XL) 5 MG 24 hr tablet TAKE 1 TABLET AT BEDTIME 11/16/17   Leone Haven, MD    Family History Family History  Problem Relation Age of Onset  . Alcoholism Other   . Arthritis Other   . Lung cancer Other   . Mental  illness Other   . Emphysema Mother   . Bipolar disorder Father   . Coronary artery disease Sister   . Schizophrenia Sister     Social History Social History   Tobacco Use  . Smoking status: Never Smoker  . Smokeless tobacco: Never Used  Substance Use Topics  . Alcohol use: Not Currently  . Drug use: No     Allergies   Patient has no known allergies.   Review of Systems Review of Systems  Constitutional: Negative for chills and fever.  HENT: Negative for ear pain and sore throat.   Eyes: Negative for pain and visual disturbance.  Respiratory: Positive for cough and shortness of breath.     Cardiovascular: Negative for chest pain and palpitations.  Gastrointestinal: Negative for abdominal pain and vomiting.  Endocrine: Negative for polyuria.  Genitourinary: Negative for dysuria.  Musculoskeletal: Negative for arthralgias and back pain.  Neurological: Negative for headaches.  All other systems reviewed and are negative.    Physical Exam Triage Vital Signs ED Triage Vitals  Enc Vitals Group     BP 12/22/18 1432 (!) 163/94     Pulse Rate 12/22/18 1432 77     Resp 12/22/18 1432 17     Temp 12/22/18 1432 98.1 F (36.7 C)     Temp Source 12/22/18 1432 Oral     SpO2 12/22/18 1432 97 %     Weight --      Height --      Head Circumference --      Peak Flow --      Pain Score 12/22/18 1430 0     Pain Loc --      Pain Edu? --      Excl. in [employer]? --    No data found.  Updated Vital Signs BP (!) 163/94 (BP Location: Right Arm)   Pulse 77   Temp 98.1 F (36.7 C) (Oral)   Resp 17   SpO2 97%   Visual Acuity Right Eye Distance:   Left Eye Distance:   Bilateral Distance:    Right Eye Near:   Left Eye Near:    Bilateral Near:     Physical Exam Vitals and nursing note reviewed.  Constitutional:      General: He is not in acute distress.    Appearance: Normal appearance. He is well-developed and normal weight. He is not ill-appearing.  HENT:     Head: Normocephalic and atraumatic.     Right Ear: Tympanic membrane normal.     Left Ear: Tympanic membrane normal.     Nose: Rhinorrhea present. No congestion.     Mouth/Throat:     Mouth: Mucous membranes are moist.     Pharynx: No oropharyngeal exudate or posterior oropharyngeal erythema.  Eyes:     General: No scleral icterus.    Conjunctiva/sclera: Conjunctivae normal.  Cardiovascular:     Rate and Rhythm: Normal rate and regular rhythm.     Heart sounds: No murmur. No friction rub. No gallop.   Pulmonary:     Comments: Patient coughs with deep inspiration. Otherwise respiratory effort is normal without  accessory muscle use. Mild Crackles appreciated on the Right Lower field on posterior. Remaining fields CTA. No wheezing or stridor appreciated.  Abdominal:     General: Bowel sounds are normal.     Palpations: Abdomen is soft.     Tenderness: There is no abdominal tenderness.  Musculoskeletal:        General: No swelling. Normal  range of motion.     Cervical back: Neck supple.     Right lower leg: No edema.     Left lower leg: No edema.  Lymphadenopathy:     Cervical: No cervical adenopathy.  Skin:    General: Skin is warm and dry.     Coloration: Skin is not jaundiced.  Neurological:     General: No focal deficit present.     Mental Status: He is alert and oriented to person, place, and time.  Psychiatric:        Mood and Affect: Mood normal.        Behavior: Behavior normal.      UC Treatments / Results  Labs (all labs ordered are listed, but only abnormal results are displayed) Labs Reviewed - No data to display  EKG   Radiology DG Chest 2 View  Result Date: 12/22/2018 CLINICAL DATA:  Cough and shortness of breath over the last 10 days. EXAM: CHEST - 2 VIEW COMPARISON:  None. FINDINGS: Heart size is normal. The lungs show mild chronic scarring. Chronic calcified granuloma at the right lung base. No sign of active infiltrate, mass, effusion or collapse. No apparent change since March. IMPRESSION: No active disease. Chronic calcified granuloma right lung base. Electronically Signed   By: Nelson Chimes M.D.   On: 12/22/2018 15:58    Procedures Procedures (including critical care time)  Medications Ordered in UC Medications - No data to display  Initial Impression / Assessment and Plan / UC Course  I have reviewed the triage vital signs and the nursing notes.  Pertinent labs & imaging results that were available during my care of the patient were reviewed by me and considered in my medical decision making (see chart for details).     Community Acquired  Pneumonia Duration of symptoms and lack of better explanation at current lend to a diagnosis of CAP. Chest Xray showed no acute disease. Called patient with results and Directed patient to continue with the Azithromycin as directed and follow up with PCP if not improving.   Elevated Blood Pressure Has history of hypertension and is monitored through his community health clinic. Informed Patient of elevation today and to be rechecked by Primary care within 2 weeks.  Final Clinical Impressions(s) / UC Diagnoses   Final diagnoses:  Community acquired pneumonia of right lower lobe of lung  Elevated blood pressure reading in office with diagnosis of hypertension     Discharge Instructions     Go to [facility] for your chest xray.  We will notify you of these results  Take the medication prescribed as directed.  If you become acutely short of breath, have a high fever or have other concerning symptoms go to the emergency department.  Your blood pressure was elevated today at 163/94, follow up with your primary care to have this rechecked in 2 weeks.    ED Prescriptions    Medication Sig Dispense Auth. Provider   azithromycin (ZITHROMAX) 250 MG tablet Take 1 tablet (250 mg total) by mouth daily. Take first 2 tablets together, then 1 every day until finished. 6 tablet Alani Lacivita, Marguerita Beards, PA-C     PDMP not reviewed this encounter.   Purnell Shoemaker, PA-C 12/22/18 1635

## 2018-12-25 NOTE — Telephone Encounter (Signed)
Please attempt to call the patient again regarding this.  Thanks.

## 2018-12-29 NOTE — Addendum Note (Signed)
Addended by: Caryl Bis, Skylynne Schlechter G on: 12/29/2018 02:16 PM   Modules accepted: Orders

## 2018-12-29 NOTE — Telephone Encounter (Signed)
I called and spoke with the patient and he stated he is ok with doing the CPAP Titration study and he wants to do uit at the facility.  He also expressed he would like to do it after January 1.  Dayona Shaheen,cma

## 2018-12-29 NOTE — Telephone Encounter (Signed)
Ordered.  I will forward to Melissa to get this scheduled for the patient.

## 2018-12-30 ENCOUNTER — Other Ambulatory Visit: Payer: Self-pay

## 2018-12-30 ENCOUNTER — Ambulatory Visit: Payer: Medicare Other | Attending: Internal Medicine

## 2018-12-30 DIAGNOSIS — Z20822 Contact with and (suspected) exposure to covid-19: Secondary | ICD-10-CM

## 2018-12-31 LAB — NOVEL CORONAVIRUS, NAA: SARS-CoV-2, NAA: NOT DETECTED

## 2019-01-02 ENCOUNTER — Other Ambulatory Visit: Payer: Self-pay

## 2019-01-02 ENCOUNTER — Ambulatory Visit
Admission: RE | Admit: 2019-01-02 | Discharge: 2019-01-02 | Disposition: A | Discharge status: Home / Self Care | Payer: Medicare Other | Source: Ambulatory Visit | Attending: Neurology | Admitting: Neurology

## 2019-01-02 DIAGNOSIS — S065XAA Traumatic subdural hemorrhage with loss of consciousness status unknown, initial encounter: Secondary | ICD-10-CM

## 2019-01-02 DIAGNOSIS — S065X9A Traumatic subdural hemorrhage with loss of consciousness of unspecified duration, initial encounter: Secondary | ICD-10-CM | POA: Diagnosis not present

## 2019-01-04 ENCOUNTER — Ambulatory Visit: Payer: Medicare Other

## 2019-01-16 ENCOUNTER — Telehealth: Payer: Self-pay | Admitting: Family Medicine

## 2019-01-16 DIAGNOSIS — R05 Cough: Secondary | ICD-10-CM

## 2019-01-16 DIAGNOSIS — R059 Cough, unspecified: Secondary | ICD-10-CM

## 2019-01-16 NOTE — Telephone Encounter (Signed)
Pt called about needing a referral to the ENT. Pt is having some issues when he's eating and drinking he starts wheezing and coughing. Pt states its in the esophogeal  Area. Pt was told he needs to send a copy of the his covid test needs to go to [facility].   Pt has a alergic reaction to [place] and spanish food. Pt would like to know if he can have a Epipen. Please advise and Thank you!  Call pt @ [phone].

## 2019-01-17 MED ORDER — EPINEPHRINE 0.3 MG/0.3ML IJ SOAJ: 0.3000 mg | INTRAMUSCULAR | 0 refills | Status: AC | PRN

## 2019-01-17 NOTE — Telephone Encounter (Signed)
I called and informed the patient that his epi pe and referral was done.  Shane Acevedo,cma

## 2019-01-17 NOTE — Telephone Encounter (Signed)
  Pt called about needing a referral to the ENT. Pt is having some issues when he's eating and drinking he starts wheezing and coughing. Pt states its in the esophogeal  Area. Pt was told he needs to send a copy of the his covid test needs to go to [facility] ENT.    Pt has a alergic reaction to [place] and spanish food. Pt would like to know if he can have a Epipen. Please advise and Thank you!

## 2019-01-17 NOTE — Telephone Encounter (Signed)
Has he seen GI for the issues that he describes?  Those are typically be things that would need to see a GI physician for.  ENT does not deal with esophageal issues.  He could be having reflux and aspiration leading to his symptoms and GI would be the person to say for that to start with.  Please find out what allergy he has to [place] and Spanish food.  If it is not anaphylaxis then he does not need an EpiPen.  If he does have anaphylaxis then he does need an EpiPen.

## 2019-01-17 NOTE — Telephone Encounter (Signed)
ENT referral placed.  I sent an EpiPen to his pharmacy.

## 2019-01-17 NOTE — Addendum Note (Signed)
Addended by: Leone Haven on: 01/17/2019 03:54 PM   Modules accepted: Orders

## 2019-01-17 NOTE — Telephone Encounter (Signed)
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  He also stated his allergy to spanish or mediterranean food caused his throat to close and his lis to swell and he had to be hospitalized and given an IV and that is why he wanted a epi pen because he had a similar reaction to peppers and he does ot know which kind or what foods the peppers would be in, also he is getting his covid vaccine on Friday and he is staying 15 mins to make sure he doeas not have a reaction this is just a FYI.  Swayzie Choate,cma

## 2019-01-24 DIAGNOSIS — R1314 Dysphagia, pharyngoesophageal phase: Secondary | ICD-10-CM | POA: Diagnosis not present

## 2019-01-24 DIAGNOSIS — R682 Dry mouth, unspecified: Secondary | ICD-10-CM | POA: Diagnosis not present

## 2019-01-24 DIAGNOSIS — R05 Cough: Secondary | ICD-10-CM | POA: Diagnosis not present

## 2019-01-24 DIAGNOSIS — H6123 Impacted cerumen, bilateral: Secondary | ICD-10-CM | POA: Diagnosis not present

## 2019-01-26 ENCOUNTER — Other Ambulatory Visit: Payer: Self-pay | Admitting: Otolaryngology

## 2019-01-26 DIAGNOSIS — R1319 Other dysphagia: Secondary | ICD-10-CM

## 2019-01-26 DIAGNOSIS — R1310 Dysphagia, unspecified: Secondary | ICD-10-CM

## 2019-01-26 DIAGNOSIS — R05 Cough: Secondary | ICD-10-CM

## 2019-01-26 DIAGNOSIS — R058 Other specified cough: Secondary | ICD-10-CM

## 2019-02-09 ENCOUNTER — Other Ambulatory Visit: Payer: Self-pay | Admitting: Family Medicine

## 2019-02-09 DIAGNOSIS — R350 Frequency of micturition: Secondary | ICD-10-CM

## 2019-02-09 DIAGNOSIS — N3281 Overactive bladder: Secondary | ICD-10-CM

## 2019-02-09 DIAGNOSIS — N401 Enlarged prostate with lower urinary tract symptoms: Secondary | ICD-10-CM

## 2019-02-22 ENCOUNTER — Encounter: Payer: Self-pay | Admitting: Family Medicine

## 2019-02-22 ENCOUNTER — Other Ambulatory Visit: Payer: Self-pay

## 2019-02-22 ENCOUNTER — Ambulatory Visit (INDEPENDENT_AMBULATORY_CARE_PROVIDER_SITE_OTHER): Payer: Medicare Other | Admitting: Family Medicine

## 2019-02-22 ENCOUNTER — Telehealth: Payer: Self-pay | Admitting: Family Medicine

## 2019-02-22 DIAGNOSIS — R200 Anesthesia of skin: Secondary | ICD-10-CM

## 2019-02-22 DIAGNOSIS — G4733 Obstructive sleep apnea (adult) (pediatric): Secondary | ICD-10-CM | POA: Diagnosis not present

## 2019-02-22 DIAGNOSIS — S065XAA Traumatic subdural hemorrhage with loss of consciousness status unknown, initial encounter: Secondary | ICD-10-CM

## 2019-02-22 DIAGNOSIS — H6121 Impacted cerumen, right ear: Secondary | ICD-10-CM

## 2019-02-22 DIAGNOSIS — H6120 Impacted cerumen, unspecified ear: Secondary | ICD-10-CM | POA: Insufficient documentation

## 2019-02-22 DIAGNOSIS — I10 Essential (primary) hypertension: Secondary | ICD-10-CM

## 2019-02-22 DIAGNOSIS — R202 Paresthesia of skin: Secondary | ICD-10-CM | POA: Diagnosis not present

## 2019-02-22 DIAGNOSIS — S065X9A Traumatic subdural hemorrhage with loss of consciousness of unspecified duration, initial encounter: Secondary | ICD-10-CM

## 2019-02-22 DIAGNOSIS — E782 Mixed hyperlipidemia: Secondary | ICD-10-CM | POA: Diagnosis not present

## 2019-02-22 NOTE — Progress Notes (Signed)
Virtual Visit via video Note  This visit type was conducted due to national recommendations for restrictions regarding the COVID-19 pandemic (e.g. social distancing).  This format is felt to be most appropriate for this patient at this time.  All issues noted in this document were discussed and addressed.  No physical exam was performed (except for noted visual exam findings with Video Visits).   I connected with Shane Acevedo today at  9:00 AM EST by a video enabled telemedicine application and verified that I am speaking with the correct person using two identifiers. Location patient: home Location provider: work  Persons participating in the virtual visit: patient, provider  I discussed the limitations, risks, security and privacy concerns of performing an evaluation and management service by telephone and the availability of in person appointments. I also discussed with the patient that there may be a patient responsible charge related to this service. The patient expressed understanding and agreed to proceed.  Reason for visit: follow-up  HPI: OSA: Patient notes no hypersomnia.  He does wake up well rested some days.  He notes he did not sleep well when he did the home sleep study.  He would like to do an in lab study in the future.  Hypertension: Typically 118-130/70s.  Taking amlodipine.  No chest pain or shortness of breath.  Subdural hematoma: He underwent repeat CT imaging that revealed resolution of his subdural hematoma.  He notes his headache symptoms have resolved.  Cerumen impaction: He saw ENT and his right ear was clogged.  They took out a huge amount of wax in his ear feels better.  Hyperlipidemia: Taking Lipitor and Zetia.  No right upper quadrant pain or myalgias.  Foot numbness: Patient notes toe and ankle numbness and tingling just when he is exercising.  He wonders if it is related to his back having dealt with 2 fractures in the past.  1 of those fractures occurred  when he fell off the roof.  He notes no numbness at other times.  He does note some muscle soreness with exercise.  He notes the symptoms all started after he got back into exercising after a period of time completing no exercise.   ROS: See pertinent positives and negatives per HPI.  Past Medical History:  Diagnosis Date  . Allergy   . Anxiety   . Depression   . Heart disease   . Hypertension   . Subdural hematoma ([place]) 10/19/2018    Past Surgical History:  Procedure Laterality Date  . CORONARY STENT PLACEMENT Right   . TONSILLECTOMY    . TOTAL HIP ARTHROPLASTY Right     Family History  Problem Relation Age of Onset  . Alcoholism Other   . Arthritis Other   . Lung cancer Other   . Mental illness Other   . Emphysema Mother   . Bipolar disorder Father   . Coronary artery disease Sister   . Schizophrenia Sister     SOCIAL HX: Non-smoker   Current Outpatient Medications:  .  amLODipine (NORVASC) 10 MG tablet, TAKE 1 TABLET DAILY, Disp: 90 tablet, Rfl: 4 .  aspirin EC 81 MG tablet, Take 81 mg by mouth daily., Disp: , Rfl:  .  atorvastatin (LIPITOR) 40 MG tablet, Take 1 tablet (40 mg total) by mouth daily., Disp: 90 tablet, Rfl: 1 .  azithromycin (ZITHROMAX) 250 MG tablet, Take 1 tablet (250 mg total) by mouth daily. Take first 2 tablets together, then 1 every day until finished., Disp:  6 tablet, Rfl: 0 .  Cholecalciferol (VITAMIN D3 PO), Take 1,000 Units by mouth., Disp: , Rfl:  .  EPINEPHrine 0.3 mg/0.3 mL IJ SOAJ injection, Inject 0.3 mLs (0.3 mg total) into the muscle as needed for anaphylaxis., Disp: 2 each, Rfl: 0 .  ezetimibe (ZETIA) 10 MG tablet, TAKE 1 TABLET DAILY, Disp: 90 tablet, Rfl: 2 .  Multiple Vitamins-Minerals (CENTRUM SILVER PO), Take by mouth., Disp: , Rfl:  .  omeprazole (PRILOSEC) 20 MG capsule, Take 2 capsules (40 mg total) by mouth daily., Disp: 28 capsule, Rfl: 0 .  oxybutynin (DITROPAN-XL) 5 MG 24 hr tablet, TAKE 1 TABLET AT BEDTIME, Disp: 90 tablet,  Rfl: 3  EXAM:  VITALS per patient if applicable:  GENERAL: alert, oriented, appears well and in no acute distress  HEENT: atraumatic, conjunttiva clear, no obvious abnormalities on inspection of external nose and ears  NECK: normal movements of the head and neck  LUNGS: on inspection no signs of respiratory distress, breathing rate appears normal, no obvious gross SOB, gasping or wheezing  CV: no obvious cyanosis  MS: moves all visible extremities without noticeable abnormality  PSYCH/NEURO: pleasant and cooperative, no obvious depression or anxiety, speech and thought processing grossly intact  ASSESSMENT AND PLAN:  Discussed the following assessment and plan:  Hypertension Adequately controlled.  Continue current regimen.  OSA (obstructive sleep apnea) Plan for an in lab sleep study.  He defers this currently given the COVID-19 pandemic.  We will plan on revisiting this at follow-up in 4 months.  Subdural hematoma (HCC) Resolved on imaging.  Symptoms have resolved.  Hyperlipidemia Continue current regimen.  CMP and lipid panel to be completed at his living facility.  Cerumen impaction Improved with treatment by ENT.  Numbness and tingling of foot Only occurs when exercising.  Discussed it could be nerve impingement.  Potentially related to his exercising.  Advised to monitor and if not improving as he gets back into exercise he should let us know.   No orders of the defined types were placed in this encounter.   No orders of the defined types were placed in this encounter.    I discussed the assessment and treatment plan with the patient. The patient was provided an opportunity to ask questions and all were answered. The patient agreed with the plan and demonstrated an understanding of the instructions.   The patient was advised to call back or seek an in-person evaluation if the symptoms worsen or if the condition fails to improve as anticipated.   [name], MD

## 2019-02-22 NOTE — Assessment & Plan Note (Signed)
Adequately controlled.  Continue current regimen. 

## 2019-02-22 NOTE — Assessment & Plan Note (Signed)
Improved with treatment by ENT.

## 2019-02-22 NOTE — Assessment & Plan Note (Addendum)
Continue current regimen.  CMP and lipid panel to be completed at his living facility.

## 2019-02-22 NOTE — Assessment & Plan Note (Signed)
Resolved on imaging.  Symptoms have resolved.

## 2019-02-22 NOTE — Telephone Encounter (Signed)
Left message with wife for pt to call back and schedule 34m f/u

## 2019-02-22 NOTE — Assessment & Plan Note (Signed)
Plan for an in lab sleep study.  He defers this currently given the COVID-19 pandemic.  We will plan on revisiting this at follow-up in 4 months.

## 2019-02-22 NOTE — Assessment & Plan Note (Signed)
Only occurs when exercising.  Discussed it could be nerve impingement.  Potentially related to his exercising.  Advised to monitor and if not improving as he gets back into exercise he should let us know.

## 2019-02-24 NOTE — Progress Notes (Signed)
I called and informed Shane Acevedo to draw an Lipid Panel and CMP on this patient in a week or 2 and he understood.  Shane Acevedo,cma

## 2019-03-02 DIAGNOSIS — E785 Hyperlipidemia, unspecified: Secondary | ICD-10-CM | POA: Diagnosis not present

## 2019-03-02 DIAGNOSIS — I10 Essential (primary) hypertension: Secondary | ICD-10-CM | POA: Diagnosis not present

## 2019-03-02 LAB — BASIC METABOLIC PANEL
BUN: 14 (ref 4–21)
CO2: 22 (ref 13–22)
Chloride: 104 (ref 99–108)
Creatinine: 1.1 (ref <=1.3)
Glucose: 96
Potassium: 4.5 (ref 3.4–5.3)
Sodium: 142 (ref 137–147)

## 2019-03-02 LAB — HEPATIC FUNCTION PANEL
ALT: 15 (ref 10–40)
AST: 18 (ref 14–40)
Alkaline Phosphatase: 76 (ref 25–125)
Bilirubin, Total: 0.7

## 2019-03-02 LAB — LIPID PANEL
Cholesterol: 113 (ref 0–200)
HDL: 43 (ref 35–70)
Triglycerides: 76 (ref 40–160)

## 2019-03-02 LAB — COMPREHENSIVE METABOLIC PANEL: Calcium: 9.5 (ref 8.7–10.7)

## 2019-04-04 ENCOUNTER — Other Ambulatory Visit: Payer: Self-pay | Admitting: Family Medicine

## 2019-04-04 DIAGNOSIS — E785 Hyperlipidemia, unspecified: Secondary | ICD-10-CM

## 2019-04-16 ENCOUNTER — Other Ambulatory Visit: Payer: Self-pay | Admitting: Family Medicine

## 2019-05-03 ENCOUNTER — Other Ambulatory Visit: Payer: Self-pay | Admitting: Family Medicine

## 2019-07-06 ENCOUNTER — Telehealth: Payer: Self-pay | Admitting: Family Medicine

## 2019-07-06 ENCOUNTER — Telehealth: Payer: Self-pay | Admitting: Cardiovascular Disease

## 2019-07-06 NOTE — Telephone Encounter (Signed)
Pt called to inform Dr. Caryl Bis that him and his wife are moving to [place]

## 2019-07-06 NOTE — Telephone Encounter (Signed)
Noted  

## 2019-07-06 NOTE — Telephone Encounter (Signed)
Pt called to inform Dr. Caryl Bis that him and his wife are moving to [place].  Jazara Swiney,cma

## 2019-07-06 NOTE — Telephone Encounter (Signed)
Patient is moving to [place], [place] next week and wanted to make office aware. Patient does not currently have a new provider set up. For further notice medications can still be sent to [facility] as they have been notified of the new address

## 2019-07-10 NOTE — Telephone Encounter (Signed)
Spoke with patient and he wanted to let us know if he should need refills before he establishes care with provider in the new area. He verbalized understanding with no further questions.

## 2019-08-23 ENCOUNTER — Other Ambulatory Visit: Payer: Self-pay | Admitting: Cardiovascular Disease

## 2019-08-29 ENCOUNTER — Other Ambulatory Visit: Payer: Self-pay | Admitting: Cardiovascular Disease

## 2019-09-14 DIAGNOSIS — Z955 Presence of coronary angioplasty implant and graft: Secondary | ICD-10-CM | POA: Diagnosis not present

## 2019-09-14 DIAGNOSIS — I119 Hypertensive heart disease without heart failure: Secondary | ICD-10-CM | POA: Diagnosis not present

## 2019-09-14 DIAGNOSIS — I2510 Atherosclerotic heart disease of native coronary artery without angina pectoris: Secondary | ICD-10-CM | POA: Diagnosis not present

## 2019-09-14 DIAGNOSIS — E7800 Pure hypercholesterolemia, unspecified: Secondary | ICD-10-CM | POA: Diagnosis not present

## 2019-09-24 DIAGNOSIS — G4733 Obstructive sleep apnea (adult) (pediatric): Secondary | ICD-10-CM | POA: Diagnosis not present

## 2019-09-24 DIAGNOSIS — R0602 Shortness of breath: Secondary | ICD-10-CM | POA: Diagnosis not present

## 2019-09-25 DIAGNOSIS — R0602 Shortness of breath: Secondary | ICD-10-CM | POA: Diagnosis not present

## 2019-09-25 DIAGNOSIS — G4733 Obstructive sleep apnea (adult) (pediatric): Secondary | ICD-10-CM | POA: Diagnosis not present

## 2019-09-26 DIAGNOSIS — Z23 Encounter for immunization: Secondary | ICD-10-CM | POA: Diagnosis not present

## 2019-09-29 DIAGNOSIS — H2513 Age-related nuclear cataract, bilateral: Secondary | ICD-10-CM | POA: Diagnosis not present

## 2019-09-29 DIAGNOSIS — H04123 Dry eye syndrome of bilateral lacrimal glands: Secondary | ICD-10-CM | POA: Diagnosis not present

## 2019-10-11 ENCOUNTER — Telehealth: Payer: Self-pay

## 2019-10-11 NOTE — Telephone Encounter (Signed)
Called patient from Recall list.  Patient and wife has recently moved out of state.  Wanted me to make Dr. Rockey Situ Aware they have appreciated everything he has done.   Will delete recall.

## 2019-10-16 DIAGNOSIS — I10 Essential (primary) hypertension: Secondary | ICD-10-CM | POA: Diagnosis not present

## 2019-10-16 DIAGNOSIS — Z1211 Encounter for screening for malignant neoplasm of colon: Secondary | ICD-10-CM | POA: Diagnosis not present

## 2019-10-16 DIAGNOSIS — K2100 Gastro-esophageal reflux disease with esophagitis, without bleeding: Secondary | ICD-10-CM | POA: Diagnosis not present

## 2019-10-16 DIAGNOSIS — Z6824 Body mass index (BMI) 24.0-24.9, adult: Secondary | ICD-10-CM | POA: Diagnosis not present

## 2019-10-16 DIAGNOSIS — F411 Generalized anxiety disorder: Secondary | ICD-10-CM | POA: Diagnosis not present

## 2019-10-16 DIAGNOSIS — K581 Irritable bowel syndrome with constipation: Secondary | ICD-10-CM | POA: Diagnosis not present

## 2019-10-16 DIAGNOSIS — Z1331 Encounter for screening for depression: Secondary | ICD-10-CM | POA: Diagnosis not present

## 2019-10-16 DIAGNOSIS — G4701 Insomnia due to medical condition: Secondary | ICD-10-CM | POA: Diagnosis not present

## 2019-10-16 DIAGNOSIS — R0683 Snoring: Secondary | ICD-10-CM | POA: Diagnosis not present

## 2019-10-16 DIAGNOSIS — E559 Vitamin D deficiency, unspecified: Secondary | ICD-10-CM | POA: Diagnosis not present

## 2019-10-16 DIAGNOSIS — M2550 Pain in unspecified joint: Secondary | ICD-10-CM | POA: Diagnosis not present

## 2019-10-16 DIAGNOSIS — E785 Hyperlipidemia, unspecified: Secondary | ICD-10-CM | POA: Diagnosis not present

## 2019-10-16 DIAGNOSIS — Z0001 Encounter for general adult medical examination with abnormal findings: Secondary | ICD-10-CM | POA: Diagnosis not present

## 2019-10-16 DIAGNOSIS — Z136 Encounter for screening for cardiovascular disorders: Secondary | ICD-10-CM | POA: Diagnosis not present

## 2019-10-16 DIAGNOSIS — N401 Enlarged prostate with lower urinary tract symptoms: Secondary | ICD-10-CM | POA: Diagnosis not present

## 2019-10-16 DIAGNOSIS — L989 Disorder of the skin and subcutaneous tissue, unspecified: Secondary | ICD-10-CM | POA: Diagnosis not present

## 2019-10-17 DIAGNOSIS — Z1211 Encounter for screening for malignant neoplasm of colon: Secondary | ICD-10-CM | POA: Diagnosis not present

## 2019-10-19 DIAGNOSIS — Z1211 Encounter for screening for malignant neoplasm of colon: Secondary | ICD-10-CM | POA: Diagnosis not present

## 2019-10-20 ENCOUNTER — Ambulatory Visit: Payer: Medicare Other

## 2019-10-23 DIAGNOSIS — G4730 Sleep apnea, unspecified: Secondary | ICD-10-CM | POA: Diagnosis not present

## 2019-11-06 DIAGNOSIS — Z136 Encounter for screening for cardiovascular disorders: Secondary | ICD-10-CM | POA: Diagnosis not present

## 2019-11-06 DIAGNOSIS — I10 Essential (primary) hypertension: Secondary | ICD-10-CM | POA: Diagnosis not present

## 2019-11-06 DIAGNOSIS — G4710 Hypersomnia, unspecified: Secondary | ICD-10-CM | POA: Diagnosis not present

## 2019-11-06 DIAGNOSIS — R5383 Other fatigue: Secondary | ICD-10-CM | POA: Diagnosis not present

## 2019-11-06 DIAGNOSIS — M2550 Pain in unspecified joint: Secondary | ICD-10-CM | POA: Diagnosis not present

## 2019-11-06 DIAGNOSIS — Z23 Encounter for immunization: Secondary | ICD-10-CM | POA: Diagnosis not present

## 2019-11-06 DIAGNOSIS — K2100 Gastro-esophageal reflux disease with esophagitis, without bleeding: Secondary | ICD-10-CM | POA: Diagnosis not present

## 2019-11-06 DIAGNOSIS — Z8679 Personal history of other diseases of the circulatory system: Secondary | ICD-10-CM | POA: Diagnosis not present

## 2019-11-06 DIAGNOSIS — Z6823 Body mass index (BMI) 23.0-23.9, adult: Secondary | ICD-10-CM | POA: Diagnosis not present

## 2019-11-06 DIAGNOSIS — M40209 Unspecified kyphosis, site unspecified: Secondary | ICD-10-CM | POA: Diagnosis not present

## 2019-11-06 DIAGNOSIS — F0390 Unspecified dementia without behavioral disturbance: Secondary | ICD-10-CM | POA: Diagnosis not present

## 2019-11-28 ENCOUNTER — Other Ambulatory Visit: Payer: Self-pay | Admitting: Cardiovascular Disease

## 2019-12-13 DIAGNOSIS — I351 Nonrheumatic aortic (valve) insufficiency: Secondary | ICD-10-CM | POA: Diagnosis not present

## 2019-12-13 DIAGNOSIS — I25119 Atherosclerotic heart disease of native coronary artery with unspecified angina pectoris: Secondary | ICD-10-CM | POA: Diagnosis not present

## 2019-12-13 DIAGNOSIS — Z955 Presence of coronary angioplasty implant and graft: Secondary | ICD-10-CM | POA: Diagnosis not present

## 2019-12-13 DIAGNOSIS — R0602 Shortness of breath: Secondary | ICD-10-CM | POA: Diagnosis not present

## 2020-02-05 ENCOUNTER — Other Ambulatory Visit: Payer: Self-pay | Admitting: Family Medicine

## 2020-02-05 DIAGNOSIS — N3281 Overactive bladder: Secondary | ICD-10-CM

## 2020-02-05 DIAGNOSIS — N401 Enlarged prostate with lower urinary tract symptoms: Secondary | ICD-10-CM

## 2020-03-15 ENCOUNTER — Other Ambulatory Visit: Payer: Self-pay | Admitting: Family Medicine

## 2020-04-01 ENCOUNTER — Other Ambulatory Visit: Payer: Self-pay | Admitting: Family Medicine

## 2020-04-01 DIAGNOSIS — E785 Hyperlipidemia, unspecified: Secondary | ICD-10-CM

## 2020-08-27 IMAGING — MR MR HEAD WO/W CM
14 series · 45 of 48 positions shown · IV contrast (7.5ml Gadavist)
Comparison: None.

CLINICAL DATA: Headache.

EXAM:
MRI HEAD WITHOUT AND WITH CONTRAST
TECHNIQUE: Multiplanar, multiecho pulse sequences of the brain and surrounding
structures were obtained without and with intravenous contrast.
CONTRAST:  7.5mL GADAVIST GADOBUTROL 1 MMOL/ML IV SOLN

[Series 5: ax dwi_tracew · axial · 3.0mm · 0.60mm/px · z∈[-71,+78]mm · 4 of 48 slices shown]
[im 1/48]
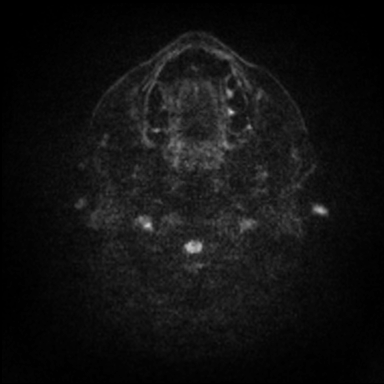
[im 16/48]
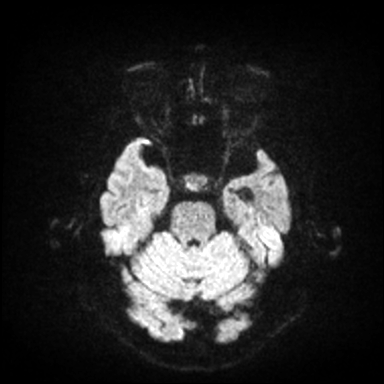
[im 32/48]
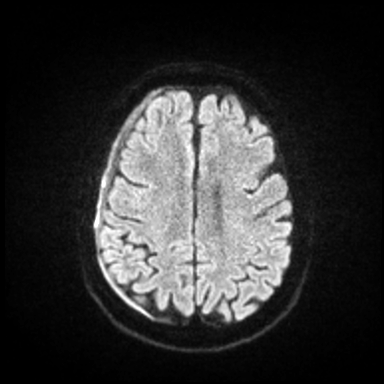
[im 48/48]
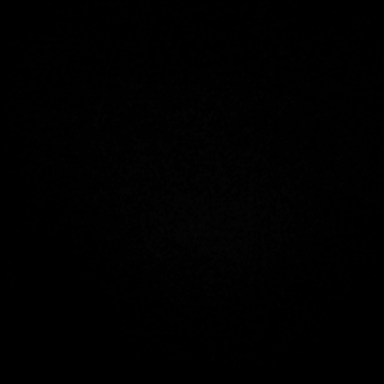

[Series 6: ax dwi_adc · axial · 3.0mm · 0.60mm/px · z∈[-71,+75]mm · 4 of 47 slices shown]
[im 1/47]
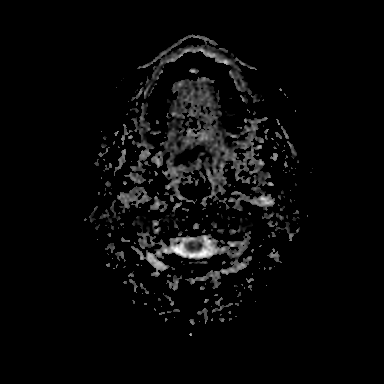
[im 16/47]
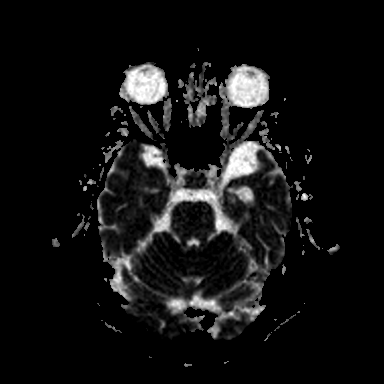
[im 31/47]
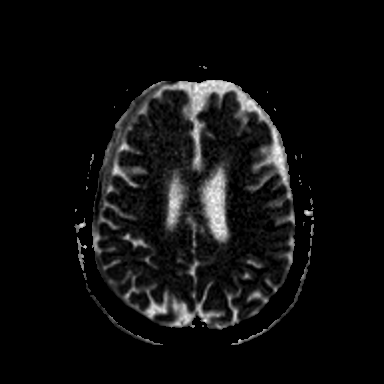
[im 47/47]
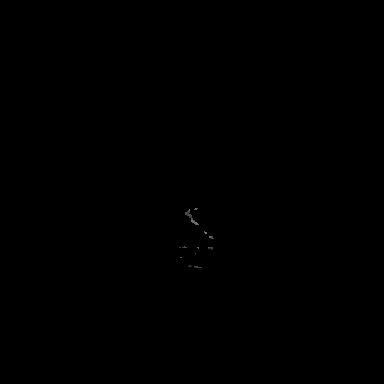

[Series 7: cor dwi_tracew · coronal · 5.0mm · 0.60mm/px · 2 of 40 slices shown]
[im 1/40]
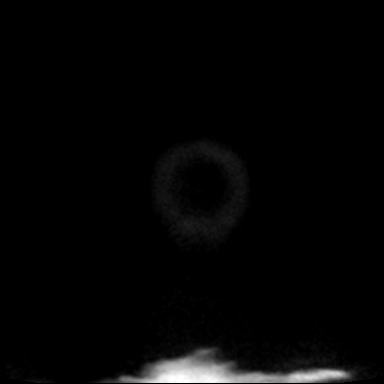
[im 40/40]
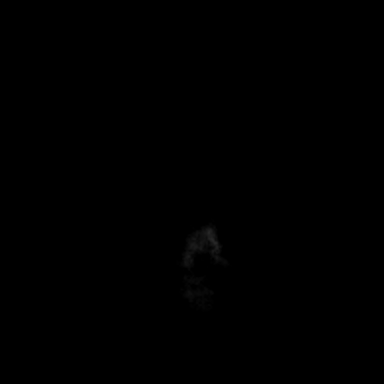

[Series 8: cor dwi_adc · coronal · 5.0mm · 0.60mm/px · 2 of 40 slices shown]
[im 1/40]
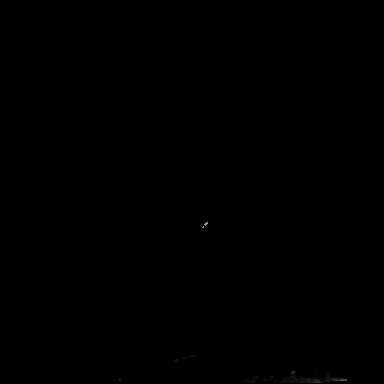
[im 40/40]
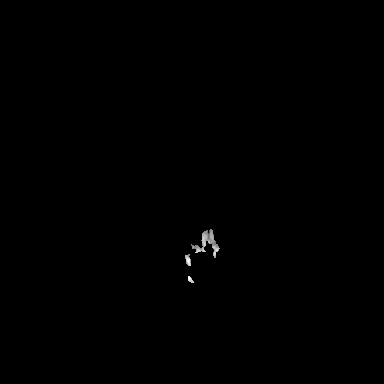

[Series 9: T1 · sagittal · 5.0mm · 0.62mm/px · 1 of 25 slices shown (1 of 2)]
[im 1/25]
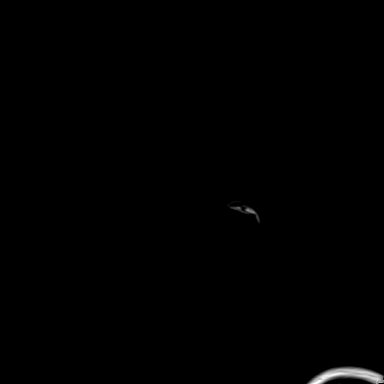

[Series 10: T2 · axial · 5.0mm · 0.53mm/px · 1 of 25 slices shown]
[im 1/25]
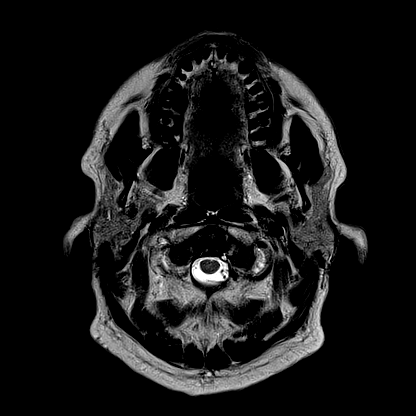

[Series 12: pha_images · axial · 3.0mm · 0.90mm/px · z∈[-75,+89]mm · 3 of 56 slices shown]
[im 1/56]
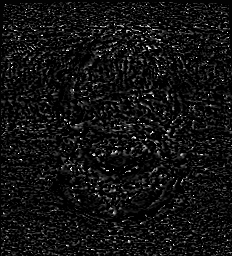
[im 28/56]
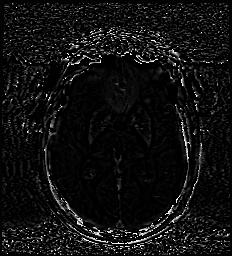
[im 56/56]
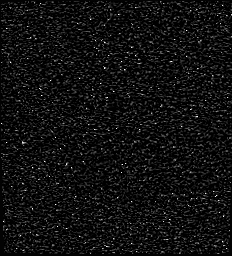

[Series 13: swi_images · axial · 3.0mm · 0.90mm/px · z∈[-81,+89]mm · 3 of 60 slices shown]
[im 1/60]
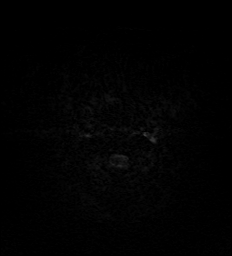
[im 30/60]
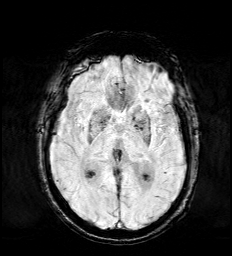
[im 60/60]
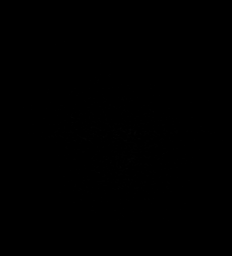

[Series 15: FLAIR · axial · 3.0mm · 0.53mm/px · z∈[-75,+80]mm · 3 of 55 slices shown]
[im 1/55]
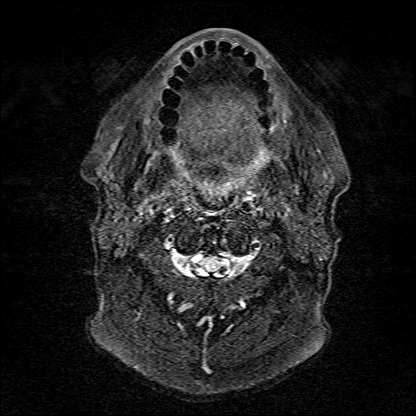
[im 28/55]
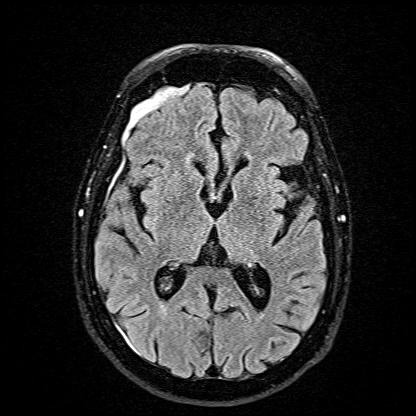
[im 55/55]
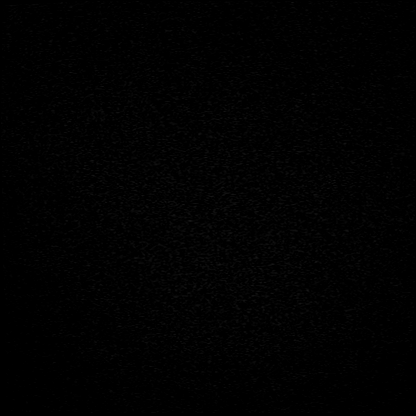

[Series 16: T1 · axial · 1.0mm · 0.98mm/px · z∈[-77,+90]mm · 8 of 176 slices shown (2 of 2)]
[im 1/176]
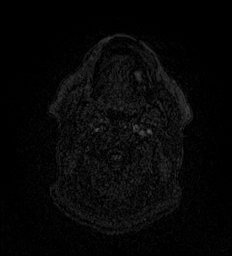
[im 20/176]
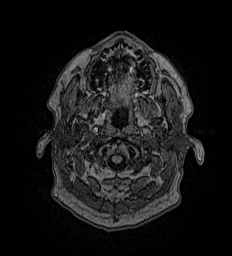
[im 59/176]
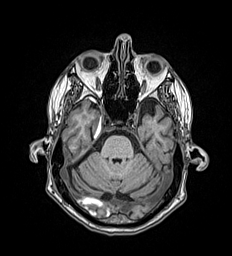
[im 78/176]
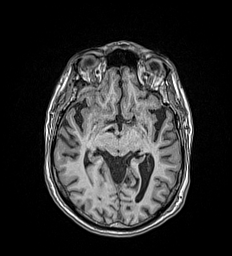
[im 98/176]
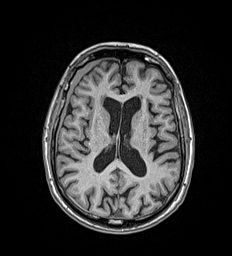
[im 117/176]
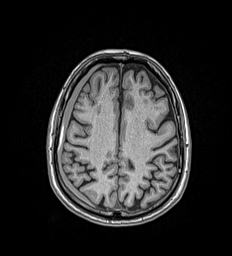
[im 156/176]
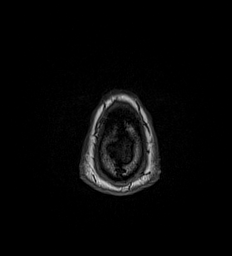
[im 176/176]
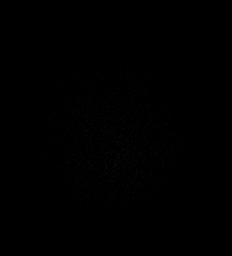

[Series 17: T2 post-contrast · coronal · 5.0mm · 0.57mm/px · 2 of 29 slices shown]
[im 1/29]
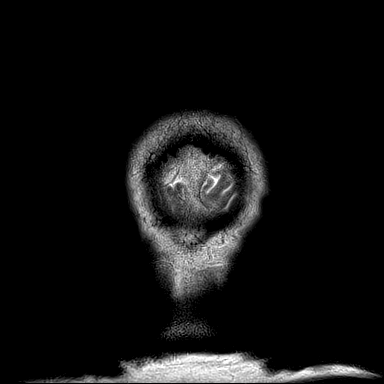
[im 29/29]
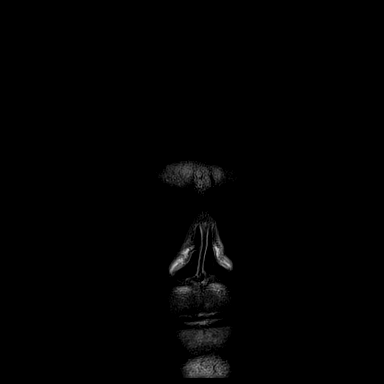

[Series 18: T1 post-contrast · axial · 1.0mm · 0.98mm/px · z∈[-77,+90]mm · 9 of 176 slices shown (1 of 3)]
[im 1/176]
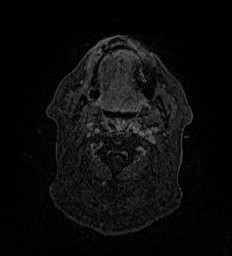
[im 20/176]
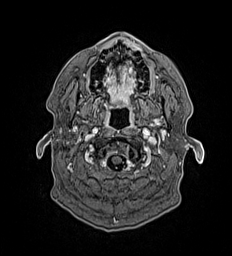
[im 39/176]
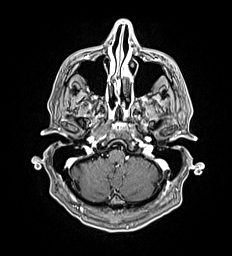
[im 59/176]
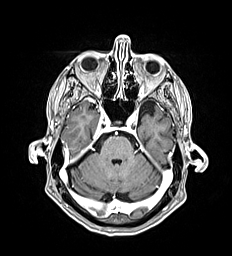
[im 78/176]
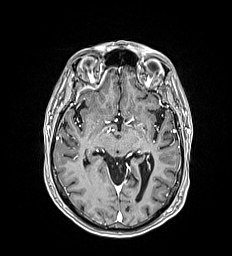
[im 98/176]
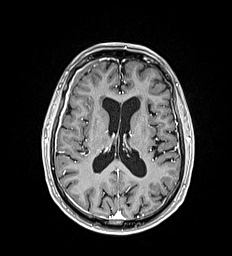
[im 117/176]
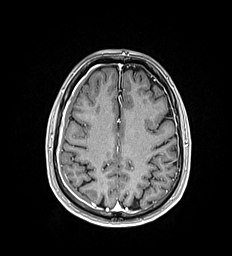
[im 156/176]
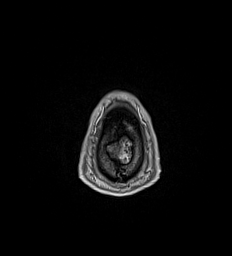
[im 176/176]
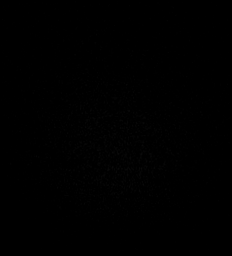

[Series 19: T1 post-contrast · coronal · 5.0mm · 0.57mm/px · 2 of 29 slices shown (2 of 3)]
[im 1/29]
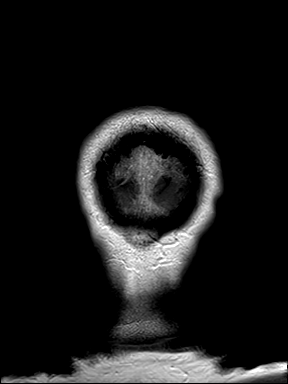
[im 29/29]
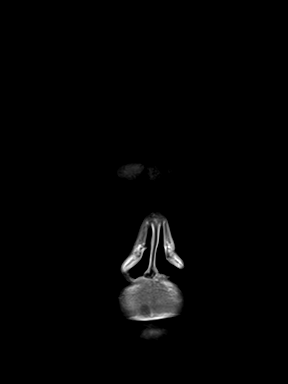

[Series 20: T1 post-contrast · sagittal · 5.0mm · 0.62mm/px · 1 of 25 slices shown (3 of 3)]
[im 1/25]
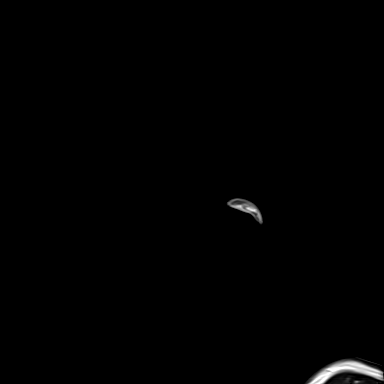

[45 of 48 positions shown; findings below may reference images not displayed]

FINDINGS: Brain: A subdural hematoma extending diffusely over the right
cerebral convexity measures up to 6 mm in thickness, is T2 and FLAIR
hyperintense, and is predominantly intermediate in T1 signal
intensity with scattered hyperintense components. There is
associated smooth dural enhancement diffusely over the right
cerebral convexity. There is no midline shift or other significant
associated mass effect.

There is no acute infarct or mass. There is mild cerebral atrophy. A
chronic microhemorrhage is noted inferiorly in the left frontal
lobe. Scattered small foci of T2 hyperintensity in the cerebral
white matter bilaterally are nonspecific but compatible with minimal
chronic small vessel ischemic disease, not considered abnormal for
age.

Vascular: Major intracranial vascular flow voids are preserved.

Skull and upper cervical spine: Unremarkable bone marrow signal.

Sinuses/Orbits: Unremarkable orbits. Paranasal sinuses and mastoid
air cells are clear.

Other: None.
IMPRESSION: 1. Right cerebral convexity subdural hematoma measuring up to 6 mm
in thickness. No significant mass effect.
2. Otherwise unremarkable appearance of the brain for age.

Critical Value/emergent results were called by telephone at the time
of interpretation on 10/06/2018 at [DATE] to Dr. Mamdouh
Yonni, who verbally acknowledged these results and asked
that the patient be directed to [REDACTED] for
further evaluation.

## 2020-09-21 IMAGING — CT CT HEAD W/O CM
3 of 4 series · 14 of 47 positions shown, 16 images · non-contrast
Comparison: CT head dated October 06, 2018.

CLINICAL DATA: Follow-up right subdural hemorrhage.

EXAM:
CT HEAD WITHOUT CONTRAST
TECHNIQUE: Contiguous axial images were obtained from the base of the skull
through the vertex without intravenous contrast.

[Series 4: head · axial · 0.35mm/px · z∈[-491,-382]mm · 8 of 69 slices shown, 10 images (1 of 3)]
[im 7/69  brain]
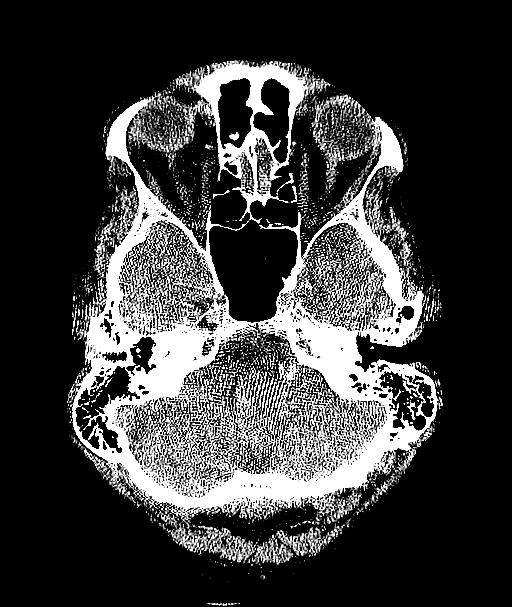
[im 7/69  bone]
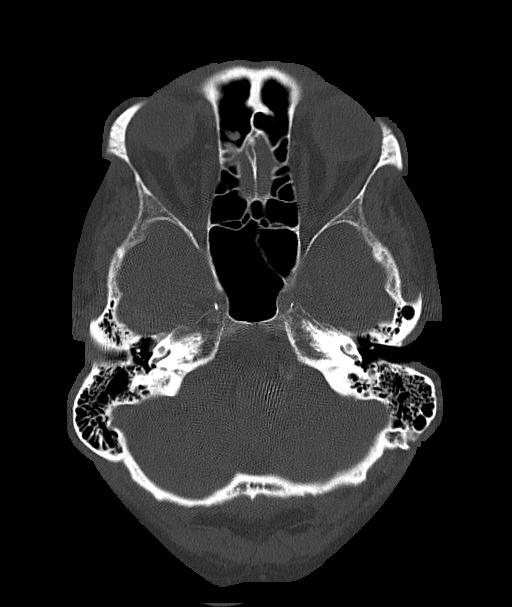
[im 13/69  brain]
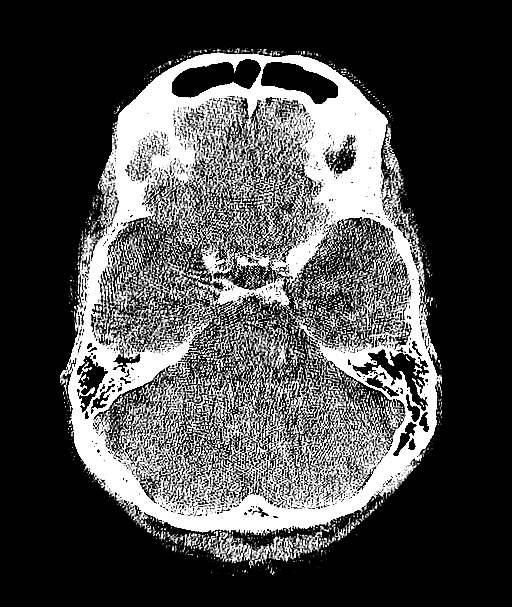
[im 23/69  brain]
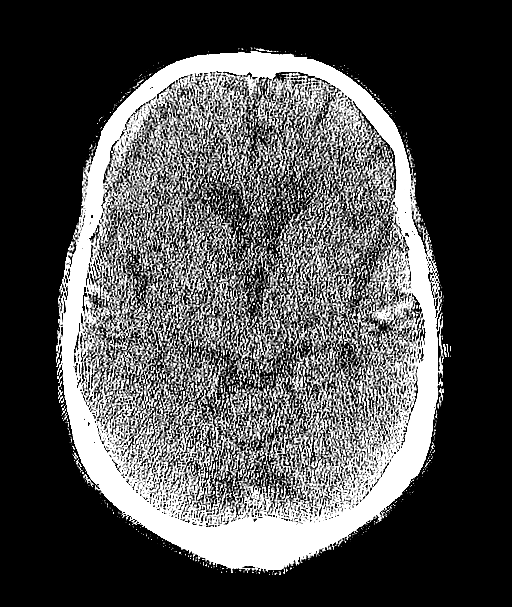
[im 30/69  brain]
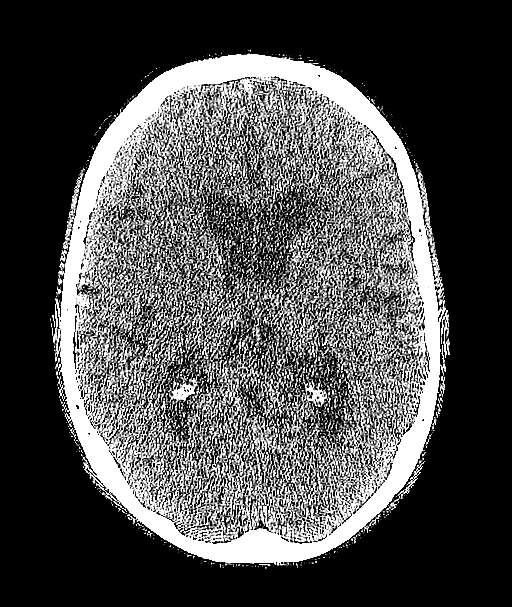
[im 39/69  brain]
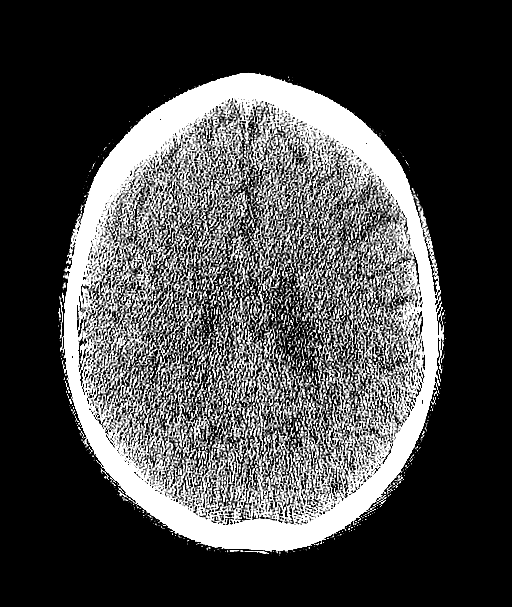
[im 39/69  bone]
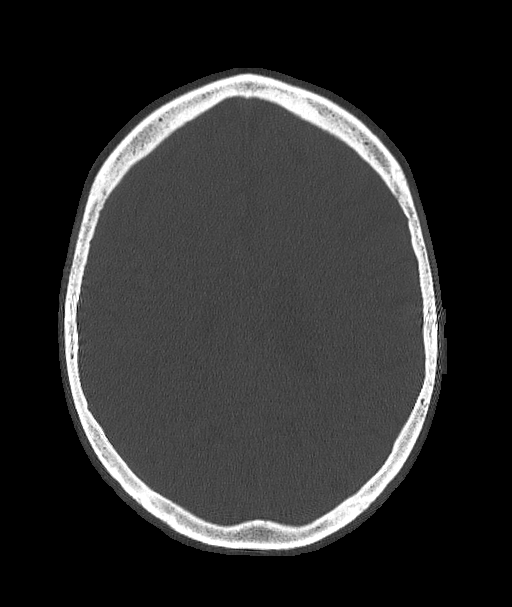
[im 46/69  brain]
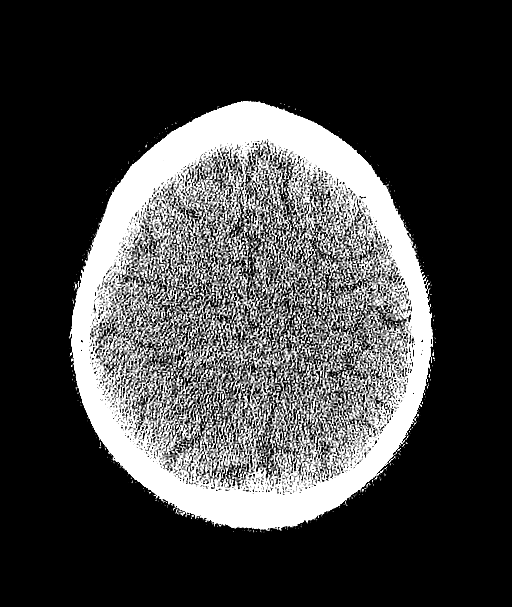
[im 56/69  brain]
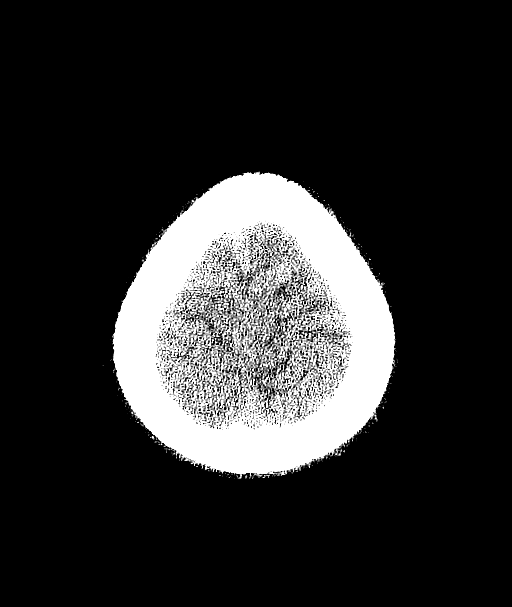
[im 62/69  brain]
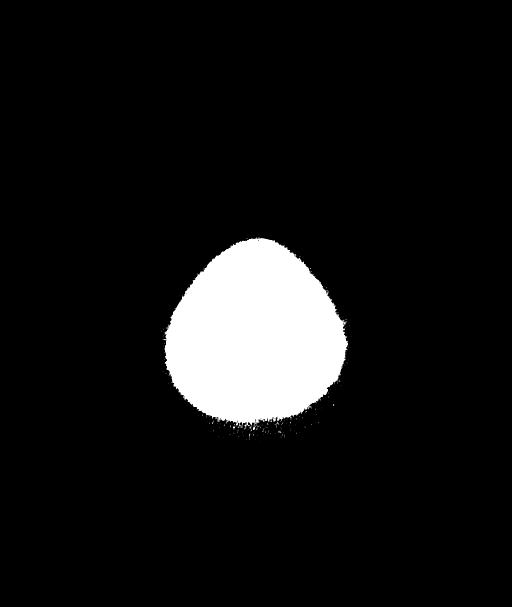

[Series 6: head · coronal · 0.27mm/px · 3 of 70 slices shown (2 of 3)]
[im 24/70  brain]
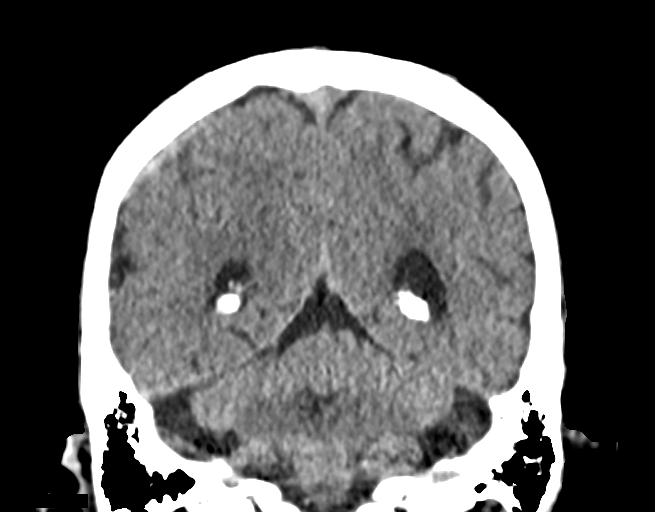
[im 31/70  brain]
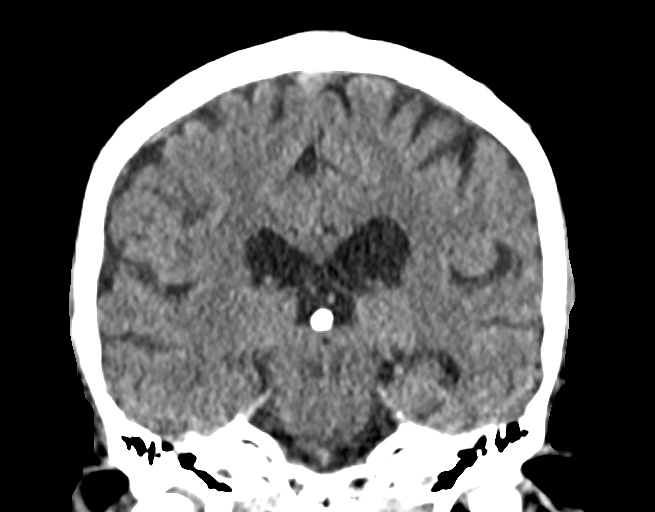
[im 39/70  brain]
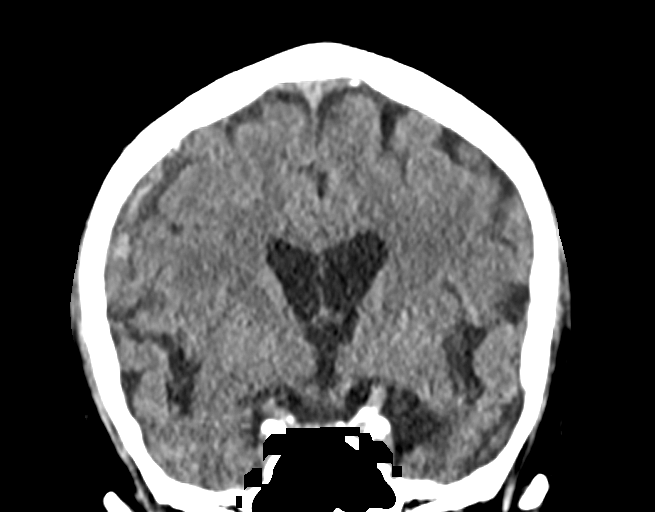

[Series 8: head · sagittal · 0.27mm/px · 3 of 59 slices shown (3 of 3)]
[im 20/59  brain]
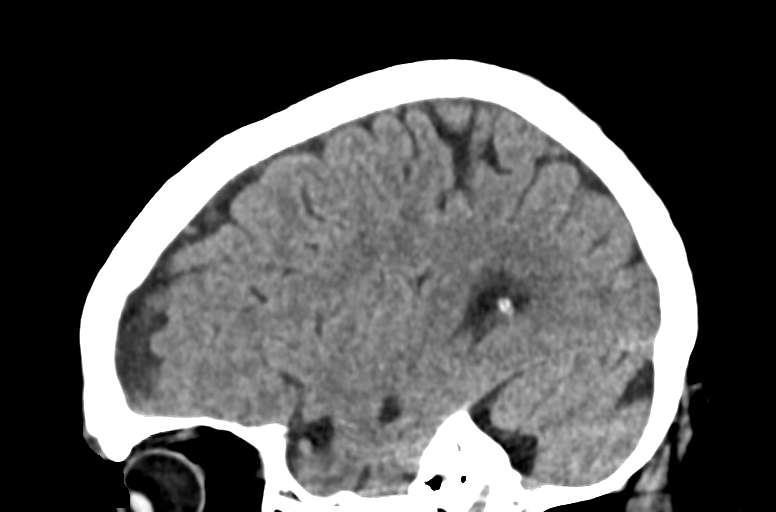
[im 30/59  brain]
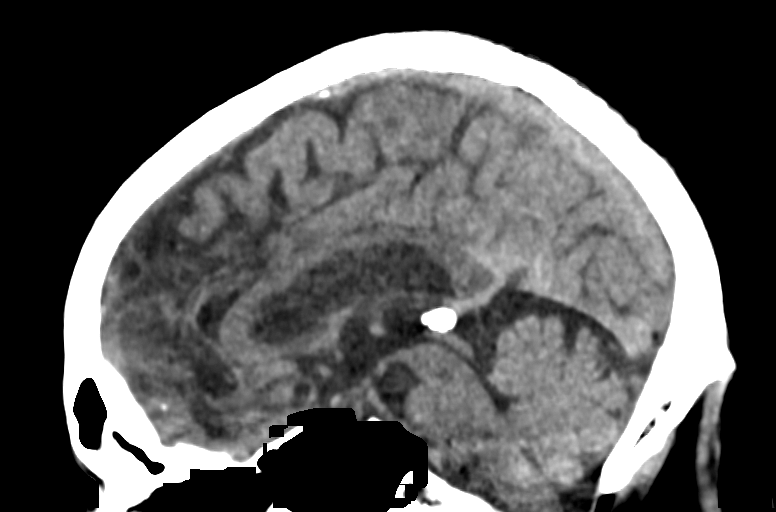
[im 39/59  brain]
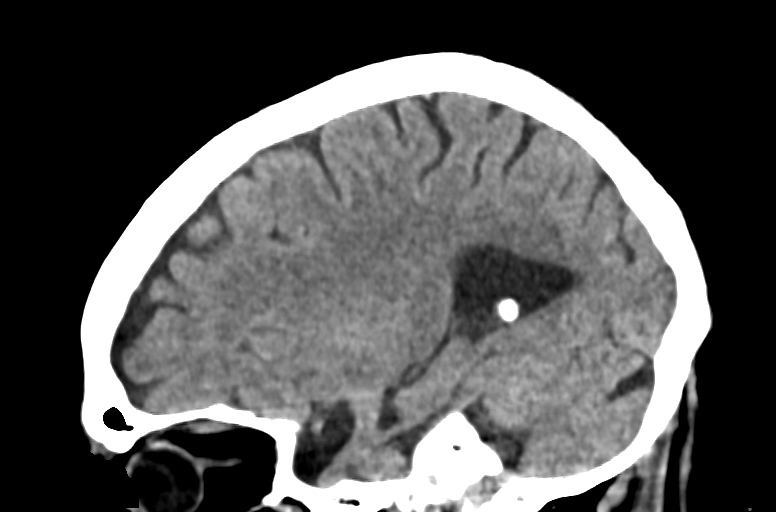

[14 of 47 positions shown; findings below may reference images not displayed]

FINDINGS: Brain: Mixed density right frontal convexity subdural hematoma has
slightly increased in thickness when compared to prior study, now
measuring up to 9 mm, previously 6 mm, likely due to redistribution.
No new hemorrhage. No evidence of acute infarction, hydrocephalus,
or mass lesion.

Vascular: No hyperdense vessel or unexpected calcification.

Skull: Normal. Negative for fracture or focal lesion.

Sinuses/Orbits: No acute finding.

Other: None.
IMPRESSION: 1. Subacute mixed density right frontal convexity subdural hematoma
has slightly increased in thickness when compared to prior study,
likely due to redistribution. No new hemorrhage or significant mass
effect.

## 2020-11-11 ENCOUNTER — Telehealth: Payer: Self-pay | Admitting: Family Medicine

## 2020-11-11 NOTE — Telephone Encounter (Signed)
Pt called in requesting for transfer of care note/chart for new Gastroenterology that Pt is seeing in [place], [place]. Pt stated that he only need chart/notes that are pertain to his omeprazole (PRILOSEC) 20 MG capsule. Pt would like for Dr. Caryl Bis to attach any referrals that may have been sent out to GI.   Jarl Sellitto,cma

## 2020-11-11 NOTE — Telephone Encounter (Signed)
Pt called in requesting for transfer of care note/chart for new Gastroenterology that Pt is seeing in [place], [place]. Pt stated that he only need chart/notes that are pertain to his omeprazole (PRILOSEC) 20 MG capsule. Pt would like for Dr. Caryl Bis to attach any referrals that may have been sent out to GI. Pt would like callback at [phone]

## 2020-11-11 NOTE — Telephone Encounter (Signed)
It sounds as though this is just a records request.  This did not need to be sent to me.  If he needs records the GI physician will need to request them from [place].

## 2021-11-10 ENCOUNTER — Encounter (INDEPENDENT_AMBULATORY_CARE_PROVIDER_SITE_OTHER): Payer: Self-pay
